# Patient Record
Sex: Female | Born: 1995 | Hispanic: No | Marital: Married
Health system: Midwestern US, Community
[De-identification: ages and names within clinical notes are randomized; demographics above are authoritative.]

## PROBLEM LIST (undated history)

## (undated) DIAGNOSIS — Z789 Other specified health status: Secondary | ICD-10-CM

## (undated) DIAGNOSIS — R059 Cough, unspecified: Secondary | ICD-10-CM

## (undated) DIAGNOSIS — G8110 Spastic hemiplegia affecting unspecified side: Principal | ICD-10-CM

## (undated) DIAGNOSIS — D480 Neoplasm of uncertain behavior of bone and articular cartilage: Secondary | ICD-10-CM

## (undated) DIAGNOSIS — Z020 Encounter for examination for admission to educational institution: Secondary | ICD-10-CM

## (undated) DIAGNOSIS — G35 Multiple sclerosis: Principal | ICD-10-CM

## (undated) HISTORY — PX: BONE TUMOR EXCISION: SHX1254

## (undated) HISTORY — DX: Other specified health status: Z78.9

## (undated) HISTORY — PX: WRIST SURGERY: SHX841

## (undated) HISTORY — PX: WISDOM TOOTH EXTRACTION: SHX21

---
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
## 2015-12-03 ENCOUNTER — Inpatient Hospital Stay: Admit: 2015-12-03 | Discharge: 2015-12-03 | Disposition: A | Discharge status: Home / Self Care

## 2015-12-03 DIAGNOSIS — S91312A Laceration without foreign body, left foot, initial encounter: Secondary | ICD-10-CM

## 2015-12-03 MED ORDER — TETANUS-DIPHTH-ACELL PERTUSSIS 5-2.5-18.5 LF-MCG/0.5 IM SUSP
Freq: Once | INTRAMUSCULAR | Status: AC
Start: 2015-12-03 — End: 2015-12-03
  Administered 2015-12-03: 14:00:00 0.5 mL via INTRAMUSCULAR

## 2015-12-03 MED FILL — BOOSTRIX 5-2.5-18.5 IM SUSP: INTRAMUSCULAR | Qty: 0.5

## 2015-12-03 NOTE — ED Notes (Signed)
Wound cleansed, antibiotic ointment applied with telfa dressing.     Gerlene Fee, [place]  [id number]

## 2015-12-03 NOTE — ED Provider Notes (Signed)
Department of Emergency Medicine  ED Provider Note  Admit Date: 12/03/2015  Room: 04/04            HPI:  12/03/15, Time: 9:24 AM  .       Brittany Fitzgerald is a 20 y.o. old female presenting to the emergency department for a laceration to the left foot, caused by metal edge, which occured 1 hour(s) prior to arrival. There is not a possibility of retained foreign body in the affected area.  The patients tetanus status is unknown.   Bleeding is  controlled. There is pain at injury site. The injury was not work related.       Review of Systems:   Pertinent positives and negatives are stated within HPI, all other systems reviewed and are negative.          --------------------------------------------- PAST HISTORY ---------------------------------------------  Past Medical History:  has no past medical history on file.    Past Surgical History:  has no past surgical history on file.    Social History:  reports that she has never smoked. She does not have any smokeless tobacco history on file. She reports that she does not drink alcohol.    Family History: family history is not on file.     The patient???s home medications have been reviewed.    Allergies: Review of patient's allergies indicates no known allergies.    -------------------------------------------------- RESULTS -------------------------------------------------  All laboratory and radiology results have been personally reviewed by myself   LABS:  No results found for this visit on 12/03/15.    RADIOLOGY:  Interpreted by Radiologist.  No orders to display       ------------------------- NURSING NOTES AND VITALS REVIEWED ---------------------------   The nursing notes within the ED encounter and vital signs as below have been reviewed.   Visit Vitals   ??? BP 101/64   ??? Pulse 50   ??? Temp 98.1 ??F (36.7 ??C) (Oral)   ??? Resp 14   ??? Wt 140 lb (63.5 kg)   ??? LMP 11/29/2015   ??? BMI 24.03 kg/m2     Oxygen Saturation Interpretation:  Normal      ---------------------------------------------------PHYSICAL EXAM--------------------------------------      Constitutional/General: Alert and oriented x3, well appearing, non toxic in NAD  Head: Normocephalic and atraumatic  Eyes: PERRL, EOMI  Mouth: Oropharynx clear, handling secretions, no trismus  Neck: Supple, full ROM,   Pulmonary: Lungs clear to auscultation bilaterally, no wheezes, rales, or rhonchi. Not in respiratory distress  Cardiovascular:  Regular rate and rhythm, no murmurs, gallops, or rubs. 2+ distal pulses  Abdomen: Soft, non tender, non distended,   Extremities: Moves all extremities x 4. Warm and well perfused  Skin: warm and dry without rash. There is a laceration of the left foot, which measures 1.0 cm. It is a partial thickness laceration. There is no evidence of foreign body or gross contamination.   Neurologic: GCS 15,  Psych: Normal Affect      ------------------------------ ED COURSE/MEDICAL DECISION MAKING----------------------  Medications   Tetanus-Diphth-Acell Pertussis (BOOSTRIX) injection 0.5 mL (not administered)             LACERATION REPAIR  PROCEDURE NOTE:  Unless otherwise indicated, this procedure was performed by PAQ-C      Laceration #: 1.  Location: Left foot  Length: 1.0 cm.  The wound area was irrigated with sterile saline, cleansend with shur-clens and draped in a sterile fashion.  The wound area was anesthetized with not required.  WOUND COMPLEXITY:    Debridement: None.  Undermining: None.  Wound Margins Revised: None.  Flaps Aligned: no.  The wound was explored with the following results no foreign body or tendon injury seen.  The wound was cleansed. No suturing was required..  Dressing:  bacitracin and a sterile dressing was placed.    Total number suture and 0 sutures were required.:       Medical Decision Making:    Patient had a partial thickness puncture to the left medial foot. This was less than 1 cm in length. It did not separate or gait. No retained  foreign bodies were appreciated. Wound was cleansed. Bacitracin and bandage were applied. Patient had her tetanus status updated.     Counseling:   The emergency provider has spoken with the patient and discussed today???s results, in addition to providing specific details for the plan of care and counseling regarding the diagnosis and prognosis.  Questions are answered at this time and they are agreeable with the plan.      --------------------------------- IMPRESSION AND DISPOSITION ---------------------------------    IMPRESSION  1. Foot laceration, left, initial encounter        DISPOSITION  Disposition: Discharge to home  Patient condition is good           Ardeen Garland, [place]  12/03/15 [phone]

## 2017-12-20 ENCOUNTER — Ambulatory Visit: Admit: 2017-12-20 | Discharge: 2017-12-20 | Attending: Family Medicine | Primary: Internal Medicine

## 2017-12-20 DIAGNOSIS — Z020 Encounter for examination for admission to educational institution: Secondary | ICD-10-CM

## 2017-12-20 MED ORDER — FLUTICASONE PROPIONATE 50 MCG/ACT NA SUSP
50 MCG/ACT | Freq: Every day | NASAL | 2 refills | Status: DC
Start: 2017-12-20 — End: 2020-02-10

## 2017-12-20 MED ORDER — DROSPIRENONE-ETHINYL ESTRADIOL 3-0.03 MG PO TABS
3-0.03 | PACK | Freq: Every day | ORAL | 5 refills | Status: DC
Start: 2017-12-20 — End: 2020-02-10

## 2017-12-20 NOTE — Progress Notes (Signed)
[facility]    12/20/2017    Brittany Fitzgerald (DOB:  1996/02/07) is a 22 y.o. female, here for a preventive medicine evaluation.    Patient Active Problem List   Diagnosis   ??? Chronic maxillary sinusitis   ??? Dysmenorrhea   ??? Encounter for school history and physical examination     Sinus issues recently.  Got x-rays and was told sinuses were full - at the dentist.  She gets a bit of drainage and it tastes bloody.  Has feeling of dizzyness for the last few months.  No fever.  Some pain in bilateral cheeks.      Periods - regular, heavy, bad cramping.  She is not on any meds for this.  Has never taken any hormones for this.    Main issues - here to have PT forms filled out.  No other complaints.    Review of Systems   Constitutional: Negative for activity change, appetite change, chills, fever and unexpected weight change.   HENT: Positive for congestion, postnasal drip, sinus pressure and sinus pain. Negative for facial swelling and sore throat.    Respiratory: Negative for cough and shortness of breath.    Gastrointestinal: Positive for constipation. Negative for blood in stool and diarrhea.   Endocrine: Negative for cold intolerance, heat intolerance, polydipsia, polyphagia and polyuria.   Genitourinary: Positive for menstrual problem. Negative for vaginal bleeding and vaginal discharge.   Musculoskeletal: Negative for arthralgias, joint swelling and myalgias.   Skin: Negative for pallor.   Neurological: Positive for dizziness, seizures and headaches.   Hematological: Negative for adenopathy. Does not bruise/bleed easily.       Prior to Visit Medications    Medication Sig Taking? Authorizing Provider   fluticasone (FLONASE) 50 MCG/ACT nasal spray 2 sprays by Each Nare route daily For 1 week and then 1 spray each nostril daily Yes Audria Nine, MD   drospirenone-ethinyl estradiol 3-0.03 MG TABS Take 1 tablet by mouth daily Start the Sunday you start your period Yes Audria Nine, MD   Multiple  Vitamins-Minerals (THERAPEUTIC MULTIVITAMIN-MINERALS) tablet Take 1 tablet by mouth daily Yes Historical Provider, MD        No Known Allergies    Past Medical History:   Diagnosis Date   ??? Febrile seizure (Isabela)        Past Surgical History:   Procedure Laterality Date   ??? WISDOM TOOTH EXTRACTION      bottom only       Social History     Socioeconomic History   ??? Marital status: Single     Spouse name: Not on file   ??? Number of children: Not on file   ??? Years of education: Not on file   ??? Highest education level: Not on file   Occupational History   ??? Occupation: Ship broker   Social Needs   ??? Financial resource strain: Not on file   ??? Food insecurity:     Worry: Not on file     Inability: Not on file   ??? Transportation needs:     Medical: Not on file     Non-medical: Not on file   Tobacco Use   ??? Smoking status: Never Smoker   ??? Smokeless tobacco: Never Used   Substance and Sexual Activity   ??? Alcohol use: Not Currently     Comment: rarely   ??? Drug use: Never   ??? Sexual activity: Yes     Partners: Male  Birth control/protection: Condom     Comment: one partner.  condom.     Lifestyle   ??? Physical activity:     Days per week: Not on file     Minutes per session: Not on file   ??? Stress: Not on file   Relationships   ??? Social connections:     Talks on phone: Not on file     Gets together: Not on file     Attends religious service: Not on file     Active member of club or organization: Not on file     Attends meetings of clubs or organizations: Not on file     Relationship status: Not on file   ??? Intimate partner violence:     Fear of current or ex partner: Not on file     Emotionally abused: Not on file     Physically abused: Not on file     Forced sexual activity: Not on file   Other Topics Concern   ??? Not on file   Social History Narrative   ??? Not on file        Family History   Problem Relation Age of Onset   ??? High Blood Pressure Father    ??? Asthma Maternal Uncle    ??? Diabetes Maternal Grandmother    ??? Coronary Art  Dis Maternal Grandmother    ??? Arthritis Maternal Grandmother        ADVANCE DIRECTIVE: N, Not Received    Vitals:    12/20/17 1318   BP: 120/70   Site: Left Upper Arm   Position: Sitting   Pulse: 90   Resp: 16   Temp: 98 ??F (36.7 ??C)   SpO2: 98%   Weight: 150 lb 12.8 oz (68.4 kg)   Height: 5\' 4"  (1.626 m)     Estimated body mass index is 25.88 kg/m?? as calculated from the following:    Height as of this encounter: 5\' 4"  (1.626 m).    Weight as of this encounter: 150 lb 12.8 oz (68.4 kg).    Physical Exam   Constitutional: She is oriented to person, place, and time. Vital signs are normal. She appears well-developed and well-nourished. She is active. No distress.   HENT:   Head: Normocephalic and atraumatic.   Right Ear: Tympanic membrane, external ear and ear canal normal.   Left Ear: Tympanic membrane, external ear and ear canal normal.   Nose: Mucosal edema present. No rhinorrhea. Right sinus exhibits maxillary sinus tenderness. Left sinus exhibits maxillary sinus tenderness.   Mouth/Throat: Uvula is midline, oropharynx is clear and moist and mucous membranes are normal.   Mucosal stippling in back of throat.  Minimal pain to pal of maxillary sinuses bilaterally.   Eyes: Conjunctivae are normal. Right eye exhibits no discharge. Left eye exhibits no discharge. No scleral icterus.   Neck: Normal range of motion. Neck supple. Carotid bruit is not present. No tracheal deviation present. No thyroid mass and no thyromegaly present.   Cardiovascular: Normal rate, regular rhythm, S1 normal, S2 normal and normal heart sounds. Exam reveals no gallop and no friction rub.   No murmur heard.  Pulmonary/Chest: Effort normal and breath sounds normal. No respiratory distress. She has no wheezes. She has no rales.   Abdominal: Soft. Bowel sounds are normal. She exhibits no distension and no mass. There is no hepatosplenomegaly. There is no tenderness. There is no rebound and no guarding.   Musculoskeletal: Normal range of motion.  She exhibits no edema.   Lymphadenopathy:     She has cervical adenopathy.   Neurological: She is alert and oriented to person, place, and time.   Skin: Skin is warm and dry. She is not diaphoretic. No pallor.   Psychiatric: She has a normal mood and affect. Her behavior is normal. Judgment and thought content normal.   Nursing note and vitals reviewed.      No flowsheet data found.    No results found for: CHOL, CHOLFAST, TRIG, TRIGLYCFAST, HDL, LDLCHOLESTEROL, LDLCALC, GLUF, GLUCOSE, LABA1C    The ASCVD Risk score Mikey Bussing DC Jr., et al., 2013) failed to calculate for the following reasons:    The 2013 ASCVD risk score is only valid for ages 69 to 74    Immunization History   Administered Date(s) Administered   ??? Tdap (Boostrix, Adacel) 12/03/2015       Health Maintenance   Topic Date Due   ??? Varicella Vaccine (1 of 2 - 13+ 2-dose series) 10/09/2007   ??? HPV vaccine (1 - Female 3-dose series) 10/08/2009   ??? HIV screen  10/08/2009   ??? Chlamydia screen  10/08/2010   ??? Cervical cancer screen  10/09/2015   ??? Flu vaccine (1) 06/01/2017   ??? DTaP/Tdap/Td vaccine (2 - Td) 12/02/2025       ASSESSMENT/PLAN:  1. Encounter for school history and physical examination  Paperwork filled out.  Titers and PPD done today.  Please see paperwork scanned in the chart.  - Mantoux testing  - HEPATITIS B SURFACE ANTIBODY; Future  - MUMPS ANTIBODY, IGG; Future  - Measles Immune IgG; Future  - RUBELLA IMMUNE; Future    2. Chronic maxillary sinusitis  Will try flonase first.  If this does not help, she will RTC and we will try an antibiotic.  - fluticasone (FLONASE) 50 MCG/ACT nasal spray; 2 sprays by Each Nare route daily For 1 week and then 1 spray each nostril daily  Dispense: 1 Bottle; Refill: 2    3. Dysmenorrhea  Pt will try OCP.  Mom had a bad experience with OCPs.  We discussed that there are multiple versions of OCPs so we can try a variety.  RTC in 3 mn to re-evaluate.  Pt is aware that she may have to adjust to meds over the next 3  mn and she may have side effects.  These were discussed.  - drospirenone-ethinyl estradiol 3-0.03 MG TABS; Take 1 tablet by mouth daily Start the Sunday you start your period  Dispense: 1 packet; Refill: 5  - C. Trachomatis / N. Gonorrhoeae, DNA Probe; Future      Return in about 3 months (around 03/22/2018) for to discuss period pain.    An electronic signature was used to authenticate this note.    --Audria Nine, MD on 12/20/2017 at 1:58 PM

## 2017-12-20 NOTE — Patient Instructions (Signed)
Patient Education        Chronic Sinusitis: Care Instructions  Your Care Instructions    Sinusitis is an infection of the lining of the sinus cavities in your head. It causes pain and pressure in your head and face.  Sinusitis can be short-term (acute) or long-term (chronic). Chronic sinusitis lasts 12 weeks or longer. It is often caused by a bacterial or fungal infection. Other things, such as allergies, may also be involved.  Chronic sinusitis may be hard to treat. It can lead to permanent changes in the mucous membranes that line the sinuses. It may make future sinus infections more likely.  The infection may take some time to treat. Antibiotics are usually used if the infection is caused by bacteria. You may also need to use a corticosteroid nasal spray. If the infection is not cured after you try two or more different antibiotics, you may want to talk with your doctor about surgery or allergy testing.  If the sinusitis is caused by a fungal infection, you may need to take antifungals or other medicines. You may also need surgery.  Follow-up care is a key part of your treatment and safety. Be sure to make and go to all appointments, and call your doctor if you are having problems. It's also a good idea to know your test results and keep a list of the medicines you take.  How can you care for yourself at home?  Medicines    Be safe with medicines. Take your medicines exactly as prescribed. Call your doctor if you think you are having a problem with your medicine. You will get more details on the specific medicines your doctor prescribes.     Take your antibiotics as directed. Do not stop taking them just because you feel better. You need to take the full course of antibiotics.     Your doctor may recommend a corticosteroid nasal spray, wash, drops, or pills. Take this medicine exactly as prescribed.   At home    Breathe warm, moist air. You can use a steamy shower, a hot bath, or a sink filled with hot  water. Avoid cold, dry air. Using a humidifier in your home may help. Follow the instructions for cleaning the machine.     Use saline (saltwater) nasal washes every day. This helps keep your nasal passages open. It also can wash out mucus and bacteria.  ? You can buy saline nose drops at a grocery store or drugstore.  ? You can make your own at home. Add 1 teaspoon of salt and 1 teaspoon of baking soda to 2 cups of distilled water. If you make your own, fill a bulb syringe with the solution. Then insert the tip into your nostril and squeeze gently. Blow your nose.     Put a warm, wet towel or a warm gel pack on your face 3 or 4 times a day. Leave it on 5 to 10 minutes each time.     Do not smoke or breathe secondhand smoke. Smoking can make sinusitis worse. If you need help quitting, talk to your doctor about stop-smoking programs and medicines. These can increase your chances of quitting for good.   When should you call for help?  Call your doctor now or seek immediate medical care if:    You have new or worse symptoms of infection, such as:  ? Increased pain, swelling, warmth, or redness.  ? Red streaks leading from the area.  ? Pus draining  from the area.  ? A fever.   Watch closely for changes in your health, and be sure to contact your doctor if:    The mucus from your nose becomes thicker (like pus) or has new blood in it.     You do not get better as expected.   Where can you learn more?  Go to [email or web address] and sign in to your MyChart account. Enter [phone] in the Search Health Information box to learn more about "Chronic Sinusitis: Care Instructions."     If you do not have an account, please click on the "Sign Up Now" link.  Current as of: December 25, 2016  Content Version: 11.9   2006-2018 [employer]. Care instructions adapted under license by [facility]. If you have questions about a medical condition or this instruction, always ask your healthcare  professional. [employer], [employer] disclaims any warranty or liability for your use of this information.         Patient Education        Painful Menstrual Cramps: Care Instructions  Your Care Instructions    Painful menstrual cramps are very common. Many women go to the doctor because of bad cramps when they get their period.  You may have cramps in your back, thighs, and belly. You may also have diarrhea, constipation, or nausea. Some women also get dizzy.  Pain medicine and home treatment can help you feel better.  Follow-up care is a key part of your treatment and safety. Be sure to make and go to all appointments, and call your doctor if you are having problems. It's also a good idea to know your test results and keep a list of the medicines you take.  How can you care for yourself at home?   Take anti-inflammatory medicines for pain. Ibuprofen (Advil, Motrin) and naproxen (Aleve) usually work better than aspirin.  ? Be safe with medicines. Talk to your doctor or pharmacist before you take any of these medicines. They may not be safe if you take other medicines or have other health problems.  ? Start taking the recommended dose of pain medicine as soon as you start to feel pain. Or you can start on the day before your period. Keep taking the medicine for as many days as you have cramps.  ? If anti-inflammatory medicines don't help, try acetaminophen (Tylenol).  ? Do not take two or more pain medicines at the same time unless the doctor told you to. Many pain medicines have acetaminophen, which is Tylenol. Too much acetaminophen (Tylenol) can be harmful.  ? Read and follow all instructions on the label.   Put a heating pad set on low or a hot water bottle on your belly. Or take a warm bath. Heat improves blood flow and may help with pain.   Lie down and put a pillow under your knees. Or lie on your side and bring your knees up to your chest. This will help with any back pressure.   Get at least 30  minutes of exercise on most days of the week. This improves blood flow and may decrease pain. Walking is a good choice. You also may want to do other activities, such as running, swimming, cycling, or playing tennis or team sports.  When should you call for help?  Call your doctor now or seek immediate medical care if:    You have new or worse belly or pelvic pain.     You have severe vaginal bleeding.  Watch closely for changes in your health, and be sure to contact your doctor if:    You have unusual vaginal bleeding.     You do not get better as expected.   Where can you learn more?  Go to [email or web address] and sign in to your MyChart account. Enter [id number] in the Search Health Information box to learn more about "Painful Menstrual Cramps: Care Instructions."     If you do not have an account, please click on the "Sign Up Now" link.  Current as of: Feb 11, 2017  Content Version: 11.9   2006-2018 [employer]. Care instructions adapted under license by [facility]. If you have questions about a medical condition or this instruction, always ask your healthcare professional. [employer] disclaims any warranty or liability for your use of this information.

## 2017-12-21 ENCOUNTER — Inpatient Hospital Stay: Payer: PRIVATE HEALTH INSURANCE | Primary: Internal Medicine

## 2017-12-23 ENCOUNTER — Encounter: Admit: 2017-12-23 | Discharge: 2017-12-23 | Payer: PRIVATE HEALTH INSURANCE | Primary: Internal Medicine

## 2017-12-23 LAB — MANTOUX TESTING: TB Skin Test: NEGATIVE

## 2017-12-23 LAB — RUBELLA IMMUNE

## 2017-12-24 ENCOUNTER — Telehealth

## 2017-12-24 LAB — MUMPS ANTIBODY, IGG

## 2017-12-24 NOTE — Telephone Encounter (Signed)
LM for pt to call back.

## 2017-12-24 NOTE — Telephone Encounter (Signed)
Please call pt.    We did not draw a varicella antibody titer on her to check for chickenpox.  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  Did she need this?  If yes, please call the lab and see if they still have the blood - can we add this to the blood?  Thanks.

## 2017-12-25 LAB — C. TRACHOMATIS / N. GONORRHOEAE, DNA
C. Trachomatis Amplified: NEGATIVE
N. Gonorrhoeae Amplified: NEGATIVE

## 2017-12-25 NOTE — Telephone Encounter (Signed)
Spoke with Oris Drone in the lab to see if the Varicella titer could be added to the order. They will check to see if   The specimen is still there since today is day 5. Will notify us if it can/not be done

## 2017-12-26 LAB — MEASLES IMMUNE IGG

## 2017-12-26 LAB — VARICELLA ZOSTER ANTIBODY, IGG

## 2017-12-26 NOTE — Telephone Encounter (Signed)
Spoke with patient, made her an appt for tomorrow, if we hear back from the lab that they can add the varicella  I will cancel the appt.

## 2017-12-26 NOTE — Telephone Encounter (Signed)
Result is posted, she is immune. Danielle please cancel the appt for Friday!   Thank you.   mb

## 2017-12-26 NOTE — Telephone Encounter (Signed)
Please let me know if I need to do anything else.  I have placed an order.  Thanks.    lnw

## 2017-12-27 ENCOUNTER — Encounter: Primary: Internal Medicine

## 2017-12-28 LAB — MISC ARUP 2

## 2017-12-30 ENCOUNTER — Encounter: Admit: 2017-12-30 | Discharge: 2017-12-30 | Primary: Internal Medicine

## 2017-12-30 DIAGNOSIS — Z111 Encounter for screening for respiratory tuberculosis: Secondary | ICD-10-CM

## 2018-01-01 ENCOUNTER — Encounter: Admit: 2018-01-01 | Discharge: 2018-01-01 | Primary: Internal Medicine

## 2018-01-01 LAB — MANTOUX TESTING: TB Skin Test: NEGATIVE

## 2018-09-27 ENCOUNTER — Emergency Department: Admit: 2018-09-27 | Payer: PRIVATE HEALTH INSURANCE | Primary: Internal Medicine

## 2018-09-27 ENCOUNTER — Inpatient Hospital Stay
Admit: 2018-09-27 | Discharge: 2018-09-27 | Disposition: A | Discharge status: Home / Self Care | Payer: PRIVATE HEALTH INSURANCE

## 2018-09-27 DIAGNOSIS — J40 Bronchitis, not specified as acute or chronic: Secondary | ICD-10-CM

## 2018-09-27 NOTE — Discharge Instructions (Addendum)
Take ibuprofen for pain and inflammation  Take robitussin DM for cough and chest congestion.

## 2018-09-27 NOTE — ED Provider Notes (Signed)
This is a 22 year old female who presents to urgent care with her mother.  She is been complaining of some right anterior lateral chest wall pain for the past couple months he got worse over the last couple days she is been coughing and having some upper respiratory and sinus issues she was seen in urgent care at another facility and was started on antibiotics for a sinus infection she is currently taking the antibiotic.  She took some Tylenol for the pain last night.  She complains of a cough.  She does have some URI symptoms denies any abdominal pain nausea vomiting diarrhea or urinary symptoms.  On first contact patient she appears to be in no acute distress.  He denies rash.          Review of Systems   Constitutional:        Pertinent positives and negatives are stated within HPI, all other systems reviewed and are negative.       Physical Exam  Vitals signs and nursing note reviewed.   Constitutional:       Appearance: She is well-developed.   HENT:      Head: Normocephalic and atraumatic.      Right Ear: Hearing, tympanic membrane, ear canal and external ear normal.      Left Ear: Hearing, tympanic membrane, ear canal and external ear normal.      Nose: Nose normal.      Mouth/Throat:      Pharynx: Uvula midline.   Eyes:      General: Lids are normal.      Conjunctiva/sclera: Conjunctivae normal.      Pupils: Pupils are equal, round, and reactive to light.   Neck:      Musculoskeletal: Normal range of motion and neck supple.   Cardiovascular:      Rate and Rhythm: Normal rate and regular rhythm.      Heart sounds: Normal heart sounds. No murmur.   Pulmonary:      Effort: Pulmonary effort is normal.      Breath sounds: Normal breath sounds. No decreased breath sounds, wheezing, rhonchi or rales.   Chest:      Comments: Some right lower anterior chest wall tenderness no abdominal pain.  Abdominal:      General: Bowel sounds are normal.      Palpations: Abdomen is soft. Abdomen is not rigid.      Tenderness:  There is no tenderness. There is no guarding or rebound.   Skin:     General: Skin is warm and dry.      Findings: No abrasion or rash.   Neurological:      Mental Status: She is alert and oriented to person, place, and time.      GCS: GCS eye subscore is 4. GCS verbal subscore is 5. GCS motor subscore is 6.      Cranial Nerves: No cranial nerve deficit.      Sensory: No sensory deficit.      Coordination: Coordination normal.      Gait: Gait normal.         Procedures    MDM       --------------------------------------------- PAST HISTORY ---------------------------------------------  Past Medical History:  has a past medical history of Febrile seizure ([place]).    Past Surgical History:  has a past surgical history that includes Wisdom tooth extraction.    Social History:  reports that she has never smoked. She has never used smokeless tobacco. She reports  previous alcohol use. She reports that she does not use drugs.    Family History: family history includes Arthritis in her maternal grandmother; Asthma in her maternal uncle; Coronary Art Dis in her maternal grandmother; Diabetes in her maternal grandmother; High Blood Pressure in her father.     The patient???s home medications have been reviewed.    Allergies: Patient has no known allergies.    -------------------------------------------------- RESULTS -------------------------------------------------  No results found for this visit on 09/27/18.  XR CHEST STANDARD (2 VW)   Final Result   1. No acute cardiopulmonary disease.          ------------------------- NURSING NOTES AND VITALS REVIEWED ---------------------------   The nursing notes within the ED encounter and vital signs as below have been reviewed.   BP 112/69    Pulse 95    Temp 98.8 ??F (37.1 ??C) (Oral)    Resp 16    Ht 5\' 4"  (1.626 m)    Wt 150 lb (68 kg)    LMP 09/24/2018    SpO2 100%    BMI 25.75 kg/m??   Oxygen Saturation Interpretation: Normal      ------------------------------------------ PROGRESS  NOTES ------------------------------------------   I have spoken with the patient and mother and discussed today???s results, in addition to providing specific details for the plan of care and counseling regarding the diagnosis and prognosis.  Their questions are answered at this time and they are agreeable with the plan.      --------------------------------- ADDITIONAL PROVIDER NOTES ---------------------------------       This patient is stable for discharge.  I have shared the specific conditions for return, as well as the importance of follow-up.      * NOTE: This report was transcribed using voice recognition software. Every effort was made to ensure accuracy; however, inadvertent computerized transcription errors may be present.    --------------------------------- IMPRESSION AND DISPOSITION ---------------------------------    IMPRESSION  1. Bronchitis        DISPOSITION  Disposition: Discharge to home  Patient condition is good       Randal Buba, PA-C  09/27/18 1543

## 2018-12-09 ENCOUNTER — Ambulatory Visit: Admit: 2018-12-09 | Discharge: 2018-12-09 | Attending: Adult Health | Primary: Internal Medicine

## 2018-12-09 DIAGNOSIS — Z020 Encounter for examination for admission to educational institution: Secondary | ICD-10-CM

## 2018-12-09 NOTE — Progress Notes (Signed)
Chief Complaint:   School/Camp Physical (physical therapy clinicals )      History of Present Illness   Source of history provided by:  patient.      Brittany Fitzgerald is a 23 y.o. old female who has a past medical history of   Patient Active Problem List   Diagnosis   ??? Chronic maxillary sinusitis   ??? Dysmenorrhea   ??? Encounter for school history and physical examination    presents to the ready care for a sports physical.  Pt states she is feeling well without any complaints at this time.  She denies any CP with exertion, DOE, dizziness with exertion, history of syncope without trauma, palpitations, heavy menstrual periods, chance of pregnancy, weakness in extremities, recent illness, previous cardiac issues,  or asthma history.  Patient had febrile seizures as a toddler, no seizures since. Denies any family history of sudden cardiac death.  Pt denies any drug, ETOH, or tobacco use.  Wears seat belt in the car at all times.  Denies any thoughts of suicide or issues at school relating to bullying.      ROS    Unless otherwise stated in this report or unable to obtain because of the patient's clinical or mental status as evidenced by the medical record, this patients's positive and negative responses for Review of Systems, constitutional, psych, eyes, ENT, cardiovascular, respiratory, gastrointestinal, neurological, genitourinary, musculoskeletal, integument systems and systems related to the presenting problem are either stated in the preceding or were not pertinent or were negative for the symptoms and/or complaints related to the medical problem.    Past Surgical History:   Past Surgical History:   Procedure Laterality Date   ??? WISDOM TOOTH EXTRACTION      bottom only     Social History:  reports that she has never smoked. She has never used smokeless tobacco. She reports previous alcohol use. She reports that she does not use drugs.  Family History: family history includes Arthritis in her maternal grandmother;  Asthma in her maternal uncle; Coronary Art Dis in her maternal grandmother; Diabetes in her maternal grandmother; High Blood Pressure in her father.   Allergies: Patient has no known allergies.    Physical Exam         VS:  BP 118/74 (Site: Right Upper Arm, Position: Sitting)    Pulse 73    Ht 5' 4.5" (1.638 m)    Wt 153 lb (69.4 kg)    SpO2 98%    BMI 25.86 kg/m??    Oxygen Saturation Interpretation: Normal.    Constitutional:  A&Ox3, NAD, development consistent with age.  Head:  NCAT  Eyes:  EOMI, PERRLA. No conjunctival injection noted.  Ears:  External ears without lesions. Ear canals without swelling or exudate. TMs translucent bilaterally with normal light reflex.  Throat:  Posterior pharynx without erythema or exudate.  Airway patient.  Neck:  Supple. Nontender without adenopathy or thyromegaly.  Lungs: CTAB without wheezing, rales, or rhonchi.  Heart:  RRR, normal heart sounds without pathological murmurs.   Abdomen:  Soft, nontender, and nondistended. BS+x4. No guarding, rigidity, or rebound tenderness. No masses.  Back:  ROM physiologic. Normal posture and spinal alignment. No costovertebral, paravertebral, intervertebral, or vertebral tenderness or spasm.  Extremities:  No tenderness or swelling. ROM physiologic.  No neurovascular deficit.  Strength and grip 5/5 bilaterally.  Skin:  Warm and appropriately dry without rashes, abrasions, ecchymoses, or lesions.  Neuro:  Orientation age-appropriate.  Motor functions intact. DTRs  2+ and equal bilaterally.  Psych: Pleasant and appropriate. Normal mood and affect.    Lab / Imaging Results   (All laboratory and radiology results have been personally reviewed by myself)  Labs:  No results found for this visit on 12/09/18.  Imaging:  All Radiology results interpreted by Radiologist unless otherwise noted.  No orders to display       Assessment / Plan     Impression(s):    1. School physical exam  Pt appears to be in good health overall. She is cleared for sports  for one year from this date without restrictions. Sports physical form scanned to chart. Advised to return immediately with any changes in physical or mental health. All questions answered.    2. PPD screening test    - Mantoux testing    3. Need for vaccination    - Hep B Vaccine Adult (ENGERIX B)      Return if symptoms worsen or fail to improve.      Retta Mac, [place]

## 2018-12-11 LAB — MANTOUX TESTING: TB Skin Test: NEGATIVE

## 2018-12-19 ENCOUNTER — Encounter: Primary: Internal Medicine

## 2019-01-06 ENCOUNTER — Encounter: Primary: Internal Medicine

## 2019-07-21 ENCOUNTER — Inpatient Hospital Stay: Payer: PRIVATE HEALTH INSURANCE | Primary: Internal Medicine

## 2019-07-21 DIAGNOSIS — Z0000 Encounter for general adult medical examination without abnormal findings: Secondary | ICD-10-CM

## 2019-07-21 LAB — COMPREHENSIVE METABOLIC PANEL
ALT: 12 U/L (ref 0–32)
AST: 17 U/L (ref 0–31)
Albumin: 4.4 g/dL (ref 3.5–5.2)
Alkaline Phosphatase: 51 U/L (ref 35–104)
Anion Gap: 9 mmol/L (ref 7–16)
BUN: 16 mg/dL (ref 6–20)
CO2: 25 mmol/L (ref 22–29)
Calcium: 9.3 mg/dL (ref 8.6–10.2)
Chloride: 102 mmol/L (ref 98–107)
Creatinine: 1.1 mg/dL — ABNORMAL HIGH (ref 0.5–1.0)
GFR African American: >60
GFR Non-African American: >60 mL/min/{1.73_m2} (ref >=60)
Glucose: 89 mg/dL (ref 74–99)
Potassium: 4 mmol/L (ref 3.5–5.0)
Sodium: 136 mmol/L (ref 132–146)
Total Bilirubin: 0.4 mg/dL (ref 0.0–1.2)
Total Protein: 7.2 g/dL (ref 6.4–8.3)

## 2019-07-21 LAB — CBC WITH AUTO DIFFERENTIAL
Basophils %: 1.2 % (ref 0.0–2.0)
Basophils Absolute: 0.06 E9/L (ref 0.00–0.20)
Eosinophils %: 6.8 % — ABNORMAL HIGH (ref 0.0–6.0)
Eosinophils Absolute: 0.34 E9/L (ref 0.05–0.50)
Hematocrit: 42 % (ref 34.0–48.0)
Hemoglobin: 13.5 g/dL (ref 11.5–15.5)
Immature Granulocytes #: 0.01 E9/L
Immature Granulocytes %: 0.2 % (ref 0.0–5.0)
Lymphocytes %: 53.7 % — ABNORMAL HIGH (ref 20.0–42.0)
Lymphocytes Absolute: 2.67 E9/L (ref 1.50–4.00)
MCH: 29.5 pg (ref 26.0–35.0)
MCHC: 32.1 % (ref 32.0–34.5)
MCV: 91.9 fL (ref 80.0–99.9)
MPV: 8.5 fL (ref 7.0–12.0)
Monocytes %: 7.2 % (ref 2.0–12.0)
Monocytes Absolute: 0.36 E9/L (ref 0.10–0.95)
Neutrophils %: 30.9 % — ABNORMAL LOW (ref 43.0–80.0)
Neutrophils Absolute: 1.53 E9/L — ABNORMAL LOW (ref 1.80–7.30)
Platelets: 307 E9/L (ref 130–450)
RBC: 4.57 E12/L (ref 3.50–5.50)
RDW: 11.9 fL (ref 11.5–15.0)
WBC: 5 E9/L (ref 4.5–11.5)

## 2019-07-21 LAB — LIPID PANEL
Cholesterol, Total: 248 mg/dL — ABNORMAL HIGH (ref 0–199)
HDL: 73 mg/dL (ref >40)
LDL Calculated: 157 mg/dL — ABNORMAL HIGH (ref 0–99)
Triglycerides: 88 mg/dL (ref 0–149)
VLDL Cholesterol Calculated: 18 mg/dL

## 2019-07-21 LAB — TSH: TSH: 2.14 u[IU]/mL (ref 0.270–4.200)

## 2019-07-21 LAB — VITAMIN D 25 HYDROXY: Vit D, 25-Hydroxy: 30 ng/mL (ref 30–100)

## 2019-07-21 LAB — T4: T4, Total: 7.5 ug/dL (ref 4.5–11.7)

## 2019-07-21 LAB — VITAMIN B12: Vitamin B-12: 710 pg/mL (ref 211–946)

## 2019-10-28 LAB — COMPREHENSIVE METABOLIC PANEL
ALT: 18 U/L (ref 0–32)
Albumin: 4.5 g/dL (ref 3.5–5.2)
Alkaline Phosphatase: 44 U/L (ref 35–104)
Anion Gap: 12 mmol/L (ref 7–16)
CO2: 24 mmol/L (ref 22–29)
Calcium: 9.5 mg/dL (ref 8.6–10.2)
Chloride: 106 mmol/L (ref 98–107)
Creatinine: 1 mg/dL (ref 0.5–1.0)
GFR African American: >60
GFR Non-African American: >60 mL/min/1.73 (ref >=60)
Glucose: 93 mg/dL (ref 74–99)
Potassium: 4.3 mmol/L (ref 3.5–5.0)
Sodium: 142 mmol/L (ref 132–146)
Total Bilirubin: 0.4 mg/dL (ref 0.0–1.2)
Total Protein: 7.2 g/dL (ref 6.4–8.3)

## 2019-10-28 LAB — MAGNESIUM: Magnesium: 2.3 mg/dL (ref 1.6–2.6)

## 2019-10-28 LAB — CK: Total CK: 1006 U/L — ABNORMAL HIGH (ref 20–180)

## 2019-11-14 LAB — COVID-19, ANTIBODY, TOTAL: SARS-CoV-2 Antibody, Total: NONREACTIVE

## 2019-11-23 ENCOUNTER — Inpatient Hospital Stay
Admit: 2019-11-23 | Discharge: 2019-11-23 | Disposition: A | Discharge status: Home / Self Care | Payer: PRIVATE HEALTH INSURANCE

## 2019-11-23 DIAGNOSIS — H8113 Benign paroxysmal vertigo, bilateral: Secondary | ICD-10-CM

## 2019-11-23 LAB — URINALYSIS
Bilirubin Urine: NEGATIVE
Glucose, Ur: NEGATIVE mg/dL
Ketones, Urine: NEGATIVE mg/dL
Leukocyte Esterase, Urine: NEGATIVE
Nitrite, Urine: NEGATIVE
Protein, UA: NEGATIVE mg/dL
Specific Gravity, UA: 1.025 (ref 1.005–1.030)
Urobilinogen, Urine: 0.2 E.U./dL (ref <2.0)
pH, UA: 6 (ref 5.0–9.0)

## 2019-11-23 LAB — CBC WITH AUTO DIFFERENTIAL
Basophils %: 1 % (ref 0.0–2.0)
Basophils Absolute: 0.05 E9/L (ref 0.00–0.20)
Eosinophils %: 6.3 % — ABNORMAL HIGH (ref 0.0–6.0)
Eosinophils Absolute: 0.31 E9/L (ref 0.05–0.50)
Hematocrit: 40.5 % (ref 34.0–48.0)
Hemoglobin: 13.3 g/dL (ref 11.5–15.5)
Immature Granulocytes #: 0.01 E9/L
Immature Granulocytes %: 0.2 % (ref 0.0–5.0)
Lymphocytes %: 51.8 % — ABNORMAL HIGH (ref 20.0–42.0)
Lymphocytes Absolute: 2.55 E9/L (ref 1.50–4.00)
MCH: 29.6 pg (ref 26.0–35.0)
MCHC: 32.8 % (ref 32.0–34.5)
MCV: 90 fL (ref 80.0–99.9)
Monocytes %: 8.1 % (ref 2.0–12.0)
Monocytes Absolute: 0.4 E9/L (ref 0.10–0.95)
Neutrophils %: 32.6 % — ABNORMAL LOW (ref 43.0–80.0)
Neutrophils Absolute: 1.6 E9/L — ABNORMAL LOW (ref 1.80–7.30)
Platelets: 315 E9/L (ref 130–450)
RBC: 4.5 E12/L (ref 3.50–5.50)
RDW: 11.6 fL (ref 11.5–15.0)
WBC: 4.9 E9/L (ref 4.5–11.5)

## 2019-11-23 LAB — POCT VENOUS
GFR African American: >60
GFR Non-African American: >60 mL/min/{1.73_m2} (ref >=60)
POC Chloride: 109 mmol/L — ABNORMAL HIGH (ref 100–108)
POC Creatinine: 0.9 mg/dL (ref 0.5–1.0)
POC Glucose: 110 mg/dl — ABNORMAL HIGH (ref 74–99)
POC Potassium: 4 mmol/L (ref 3.5–5.0)
POC Sodium: 142 mmol/L (ref 132–146)

## 2019-11-23 LAB — MICROSCOPIC URINALYSIS

## 2019-11-23 LAB — PREGNANCY, URINE: HCG(Urine) Pregnancy Test: NEGATIVE

## 2019-11-23 MED ORDER — MECLIZINE HCL 25 MG PO TABS
25 MG | ORAL_TABLET | Freq: Three times a day (TID) | ORAL | 0 refills | Status: AC | PRN
Start: 2019-11-23 — End: 2019-11-30

## 2019-11-23 MED ORDER — ONDANSETRON 4 MG PO TBDP
4 MG | ORAL_TABLET | Freq: Three times a day (TID) | ORAL | 0 refills | Status: DC | PRN
Start: 2019-11-23 — End: 2020-02-10

## 2019-11-23 NOTE — Discharge Instructions (Signed)
If no relief with the Antivert and zofran, go to the Emergency Department

## 2019-11-23 NOTE — ED Provider Notes (Addendum)
Department of Emergency Medicine  [facility]  Provider Note  Admit Date/Time: 11/23/2019  8:41 AM  Room: 03/03  NAME: Brittany Fitzgerald  DOB: 1995-12-17  MRN: [id number]     Chief Complaint:  Dizziness (Pt states "I notice it more when I change positions / I sleep on My Right side as it seems to affect the Left side of My face More" ), Nausea, and Emesis    History of Present Illness        Brittany Fitzgerald is a 24 y.o. female who has a past medical history of:   Past Medical History:   Diagnosis Date   . Febrile seizure ([facility])     49-5 years old    presents to the urgent care center by private car for a sensation that  Everything is  spinning when she moves her head or changes positions.  She said that this started yesterday and she has had some nausea and vomiting with it.  She said she does not have a headache does not have any visual change she denies any cold or sinus symptoms.  Denies any chest pain or shortness of breath.  States she has never had this before.    ROS    Pertinent positives and negatives are stated within HPI, all other systems reviewed and are negative.    Past Surgical History:   Procedure Laterality Date   . WISDOM TOOTH EXTRACTION      bottom only   Social History:  reports that she has never smoked. She has never used smokeless tobacco. She reports current alcohol use. She reports that she does not use drugs.  Family History: family history includes Arthritis in her maternal grandmother; Asthma in her maternal uncle; Coronary Art Dis in her maternal grandfather; Diabetes in her maternal grandfather; High Blood Pressure in her father.  Allergies: Patient has no known allergies.    Physical Exam   Oxygen Saturation Interpretation: Normal.   ED Triage Vitals [11/23/19 0844]   BP Temp Temp Source Pulse Resp SpO2 Height Weight   129/80 97.5 F (36.4 C) Temporal 69 16 99 % 5\' 4"  (1.626 m) 160 lb (72.6 kg)       Physical Exam   Constitutional/General: Alert and  oriented x3, well appearing, non toxic in NAD   HEENT:  NC/NT. PERRLA,  EOMS intact Airway patent.   Neck: Supple, full ROM,    Respiratory: Lungs clear to auscultation bilaterally, no wheezes, rales, or rhonchi. Not in respiratory distress   CV:  Regular rate. Regular rhythm. No murmurs, gallops, or rubs. 2+ distal pulses   GI:  Abdomen Soft, Non tender, Non distended.  +BS.   No rebound, guarding, or rigidity. No pulsatile masses.   Musculoskeletal: Moves all extremities x 4.    Integument: skin warm and dry. No rashes.    Neurologic: GCS 15, no focal deficits, symmetric strength 5/5 in the upper and lower extremities bilaterally   Psychiatric: Normal Affect    Lab / Imaging Results   (All laboratory and radiology results have been personally reviewed by myself)  Labs:  Results for orders placed or performed during the hospital encounter of 11/23/19   Pregnancy, Urine   Result Value Ref Range    HCG(Urine) Pregnancy Test NEGATIVE NEGATIVE   Urinalysis   Result Value Ref Range    Color, UA Yellow Straw/Yellow    Clarity, UA Clear Clear    Glucose, Ur Negative Negative mg/dL  Bilirubin Urine Negative Negative    Ketones, Urine Negative Negative mg/dL    Specific Gravity, UA 1.025 1.005 - 1.030    Blood, Urine MODERATE (A) Negative    pH, UA 6.0 5.0 - 9.0    Protein, UA Negative Negative mg/dL    Urobilinogen, Urine 0.2 <2.0 E.U./dL    Nitrite, Urine Negative Negative    Leukocyte Esterase, Urine Negative Negative   CBC Auto Differential   Result Value Ref Range    WBC 4.9 4.5 - 11.5 E9/L    RBC 4.50 3.50 - 5.50 E12/L    Hemoglobin 13.3 11.5 - 15.5 g/dL    Hematocrit 11.9 14.7 - 48.0 %    MCV 90.0 80.0 - 99.9 fL    MCH 29.6 26.0 - 35.0 pg    MCHC 32.8 32.0 - 34.5 %    RDW 11.6 11.5 - 15.0 fL    Platelets 315 130 - 450 E9/L    MPV 8.4 7.0 - 12.0 fL    Neutrophils % 32.6 (L) 43.0 - 80.0 %    Immature Granulocytes % 0.2 0.0 - 5.0 %    Lymphocytes % 51.8 (H) 20.0 - 42.0 %    Monocytes % 8.1 2.0 - 12.0 %     Eosinophils % 6.3 (H) 0.0 - 6.0 %    Basophils % 1.0 0.0 - 2.0 %    Neutrophils Absolute 1.60 (L) 1.80 - 7.30 E9/L    Immature Granulocytes # 0.01 E9/L    Lymphocytes Absolute 2.55 1.50 - 4.00 E9/L    Monocytes Absolute 0.40 0.10 - 0.95 E9/L    Eosinophils Absolute 0.31 0.05 - 0.50 E9/L    Basophils Absolute 0.05 0.00 - 0.20 E9/L   Microscopic Urinalysis   Result Value Ref Range    WBC, UA NONE 0 - 5 /HPF    RBC, UA 0-1 0 - 2 /HPF    Epithelial Cells, UA RARE /HPF    Bacteria, UA RARE (A) None Seen /HPF   POCT Venous   Result Value Ref Range    POC Sodium 142 132 - 146 mmol/L    POC Potassium 4.0 3.5 - 5.0 mmol/L    POC Chloride 109 (H) 100 - 108 mmol/L    POC Glucose 110 (H) 74 - 99 mg/dl    POC Creatinine 0.9 0.5 - 1.0 mg/dL    GFR Non-African American >60 >=60 mL/min/1.73    GFR African American >60     Performed on SEE BELOW      Imaging:  All Radiology results interpreted by Radiologist unless otherwise noted.  No orders to display       ED Course / Medical Decision Making   Medications - No data to display       Consult(s):    MDM:   I did check labs on her this seems like if this is a vertigo problem --- mainly everything seems to spin when she turns her head or changes positions. Her  urine does have blood in it but she is having her menstrual period.  Pregnancy was negative the remainder the labs are unremarkable.  Exam is negative she does not have any headache or pain anywhere.  I did start her on some Antivert and some Zofran and I told her if this does not help her symptoms and she needs to go to the emergency department for further evaluation.        Assessment      1. Benign paroxysmal  positional vertigo due to bilateral vestibular disorder      Plan   Discharge to home and advised to contact Calton Dach, MD  [address]  [place] [address]  [phone]    Schedule an appointment as soon as possible for a visit      Patient condition is good    New Medications     New  Prescriptions    MECLIZINE (ANTIVERT) 25 MG TABLET    Take 1 tablet by mouth 3 times daily as needed for Dizziness    ONDANSETRON (ZOFRAN ODT) 4 MG DISINTEGRATING TABLET    Take 1 tablet by mouth every 8 hours as needed for Nausea or Vomiting     Electronically signed by Reino Kent, APRN - CNP   DD: 11/23/19  **This report was transcribed using voice recognition software. Every effort was made to ensure accuracy; however, inadvertent computerized transcription errors may be present.  END OF ED PROVIDER NOTE     Reino Kent, APRN - CNP  11/23/19 0945       Reino Kent, APRN - CNP  11/23/19 0945

## 2020-02-10 ENCOUNTER — Emergency Department: Admit: 2020-02-11 | Payer: PRIVATE HEALTH INSURANCE | Primary: Internal Medicine

## 2020-02-10 DIAGNOSIS — S63501A Unspecified sprain of right wrist, initial encounter: Secondary | ICD-10-CM

## 2020-02-10 NOTE — Discharge Instructions (Addendum)
Please return to the ED with new or worsening symptoms. Follow up with orthopaedics for further evaluation and treatment for abnormalities found on wrist x-ray. (they mentioned POSSIBLE giant cell tumor of bone or endochondroma...orthopaedics will further discus this with you).

## 2020-02-10 NOTE — ED Notes (Signed)
Pt. C/o R. wrist and forearm pain after hitting a golf club into the ground while playing golf today.     Sharlyne Pacas [facility], RN  02/10/20 2157

## 2020-02-10 NOTE — ED Notes (Signed)
Velcro wrist splint applied to R. wrist.     Willette Brace, RN  02/10/20 2241

## 2020-02-10 NOTE — ED Provider Notes (Signed)
[facility]dependent [facility]    HPI:  02/10/20, Time: 8:52 PM EDT         Brittany Fitzgerald is a 24 y.o. female presenting to the ED for right wrist pain, beginning just prior to arrival after hitting the ground when trying to swing a golf club.  The complaint has been persistent, moderate in severity, and worsened by movement of right wrist.  Patient denies any previous injuries to this area.  Pain is mostly located to the medial aspect of the right wrist and does not radiate.  She is been afebrile without recent travel or sick contacts.  Patient denies all other symptoms and injuries at this time.    Review of Systems:   A complete review of systems was performed and pertinent positives and negatives are stated within HPI, all other systems reviewed and are negative.          --------------------------------------------- PAST HISTORY ---------------------------------------------  Past Medical History:  has a past medical history of Febrile seizure ([place]).    Past Surgical History:  has a past surgical history that includes Wisdom tooth extraction.    Social History:  reports that she has never smoked. She has never used smokeless tobacco. She reports current alcohol use. She reports that she does not use drugs.    Family History: family history includes Arthritis in her maternal grandmother; Asthma in her maternal uncle; Coronary Art Dis in her maternal grandfather; Diabetes in her maternal grandfather; High Blood Pressure in her father.     The patient???s home medications have been reviewed.    Allergies: Patient has no known allergies.    -------------------------------------------------- RESULTS -------------------------------------------------  All laboratory and radiology results have been personally reviewed by myself   LABS:  No results found for this visit on 02/10/20.    RADIOLOGY:  Interpreted by Radiologist.  XR HAND RIGHT (MIN 3 VIEWS)   Final Result   No acute post-traumatic abnormality seen.  Multiloculated  lucent/lytic lesion   in the distal ulna.  This could be a giant cell tumor or atypical enchondroma.         XR WRIST RIGHT (MIN 3 VIEWS)   Final Result   Multiloculated lucent lesion in the distal ulna.  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      No acute post-traumatic abnormality.             ------------------------- NURSING NOTES AND VITALS REVIEWED ---------------------------   The nursing notes within the ED encounter and vital signs as below have been reviewed.   BP (!) 128/90    Pulse 86    Temp 97.7 ??F (36.5 ??C) (Infrared)    Resp 18    Ht 5\' 4"  (1.626 m)    Wt 155 lb (70.3 kg)    LMP 02/10/2020    SpO2 97%    BMI 26.61 kg/m??   Oxygen Saturation Interpretation: Normal      ---------------------------------------------------PHYSICAL EXAM--------------------------------------      Constitutional/General: Alert and oriented x3, well appearing, non toxic in NAD  Head: Normocephalic and atraumatic  Eyes: PERRL, EOMI  Mouth: Oropharynx clear, handling secretions, no trismus  Neck: Supple, full ROM,   Pulmonary: Lungs clear to auscultation bilaterally, no wheezes, rales, or rhonchi. Not in respiratory distress  Cardiovascular:  Regular rate and rhythm, no murmurs, gallops, or rubs. 2+ distal pulses  Abdomen: Soft, non tender, non distended,   Extremities: Moves all extremities x 4. Warm and well perfused.  Tenderness  to palpation to the right wrist more particularly to the ulnar aspect which does radiate up the ulna to about a third of the way up the forearm.  Compartments are soft in her right forearm.  Capillary refill less than 2 seconds at the fingers of the right hand.  Skin: warm and dry without rash  Neurologic: GCS 15,  Psych: Normal Affect      ------------------------------ ED COURSE/MEDICAL DECISION MAKING----------------------  Medications - No data to display      ED COURSE:  ED Course as of Feb 12 1008   Wed Feb 10, 2020   2229 Discussed case with Dr. Scarlette Calico.  Agreeable to outpatient follow up.     [MS]   2230 Reassessed patient.  Remained stable and neurovascularly intact.  Discussed results with the patient and her friend.  At this time recommended very close outpatient follow-up with orthopedics for further evaluation treatment, particularly secondary to the lucent/lytic lesion noted on the x-rays today.  Will provide a wrist splint for symptomatic treatment at home.  She may use this as needed.  She may also take Tylenol Motrin as needed for pain.  Return to the ED with any new or worsening symptoms. Patient and her friend voiced understanding and are agreeable to the above treatment plan.     [MS]      ED Course User Index  [MS] Doristine Bosworth, [place]       Medical Decision Making:    See ED course above.    Counseling:   The emergency provider has spoken with the patient and friend and discussed today???s results, in addition to providing specific details for the plan of care and counseling regarding the diagnosis and prognosis.  Questions are answered at this time and they are agreeable with the plan.      --------------------------------- IMPRESSION AND DISPOSITION ---------------------------------    IMPRESSION  1. Abnormal musculoskeletal x-ray    2. Sprain of right wrist, initial encounter        DISPOSITION  Disposition: Discharge to home  Patient condition is stable      NOTE: This report was transcribed using voice recognition software. Every effort was made to ensure accuracy; however, inadvertent computerized transcription errors may be present        Doristine Bosworth, [place]  02/12/20 1009

## 2020-02-11 ENCOUNTER — Inpatient Hospital Stay
Admit: 2020-02-11 | Discharge: 2020-02-11 | Disposition: A | Discharge status: Home / Self Care | Payer: PRIVATE HEALTH INSURANCE

## 2020-02-11 ENCOUNTER — Encounter

## 2020-02-12 ENCOUNTER — Encounter

## 2020-02-18 ENCOUNTER — Encounter

## 2020-02-18 ENCOUNTER — Inpatient Hospital Stay: Admit: 2020-02-18 | Payer: PRIVATE HEALTH INSURANCE | Primary: Internal Medicine

## 2020-02-18 DIAGNOSIS — R059 Cough, unspecified: Secondary | ICD-10-CM

## 2020-02-18 DIAGNOSIS — G8110 Spastic hemiplegia affecting unspecified side: Secondary | ICD-10-CM

## 2020-02-18 DIAGNOSIS — D480 Neoplasm of uncertain behavior of bone and articular cartilage: Secondary | ICD-10-CM

## 2020-02-18 MED ORDER — GADOBENATE DIMEGLUMINE 529 MG/ML IV SOLN
529 MG/ML | Freq: Once | INTRAVENOUS | Status: AC | PRN
Start: 2020-02-18 — End: 2020-02-18
  Administered 2020-02-18: 18:00:00 14 mL via INTRAVENOUS

## 2020-03-07 LAB — COMPREHENSIVE METABOLIC PANEL
ALT: 22 U/L (ref 0–32)
AST: 19 U/L (ref 0–31)
Albumin: 3.9 g/dL (ref 3.5–5.2)
Alkaline Phosphatase: 43 U/L (ref 35–104)
Anion Gap: 17 mmol/L — ABNORMAL HIGH (ref 7–16)
BUN: 15 mg/dL (ref 6–20)
CO2: 20 mmol/L — ABNORMAL LOW (ref 22–29)
Calcium: 9.4 mg/dL (ref 8.6–10.2)
Chloride: 106 mmol/L (ref 98–107)
Creatinine: 1.1 mg/dL — ABNORMAL HIGH (ref 0.5–1.0)
GFR African American: >60
GFR Non-African American: >60 mL/min/{1.73_m2} (ref >=60)
Glucose: 98 mg/dL (ref 74–99)
Potassium: 4 mmol/L (ref 3.5–5.0)
Sodium: 143 mmol/L (ref 132–146)
Total Bilirubin: 0.3 mg/dL (ref 0.0–1.2)
Total Protein: 6.5 g/dL (ref 6.4–8.3)

## 2020-03-07 LAB — LIPID PANEL
Cholesterol, Total: 203 mg/dL — ABNORMAL HIGH (ref 0–199)
HDL: 55 mg/dL (ref >40)
LDL Calculated: 127 mg/dL — ABNORMAL HIGH (ref 0–99)
Triglycerides: 107 mg/dL (ref 0–149)
VLDL Cholesterol Calculated: 21 mg/dL

## 2020-03-07 LAB — TSH: TSH: 3.29 u[IU]/mL (ref 0.270–4.200)

## 2020-03-07 LAB — SEDIMENTATION RATE: Sed Rate: 9 mm/Hr (ref 0–20)

## 2020-03-07 LAB — T4: T4, Total: 6.7 ug/dL (ref 4.5–11.7)

## 2020-03-07 LAB — RHEUMATOID FACTOR: Rheumatoid Factor: <10 IU/mL (ref 0–13)

## 2020-03-07 LAB — CK: Total CK: 163 U/L (ref 20–180)

## 2020-03-08 LAB — ANA: ANA: NEGATIVE

## 2020-03-17 ENCOUNTER — Encounter

## 2020-03-24 ENCOUNTER — Inpatient Hospital Stay: Admit: 2020-03-24 | Payer: PRIVATE HEALTH INSURANCE | Primary: Internal Medicine

## 2020-03-24 DIAGNOSIS — G35 Multiple sclerosis: Secondary | ICD-10-CM

## 2020-03-24 MED ORDER — GADOBENATE DIMEGLUMINE 529 MG/ML IV SOLN
529 MG/ML | Freq: Once | INTRAVENOUS | Status: AC | PRN
Start: 2020-03-24 — End: 2020-03-24
  Administered 2020-03-24: 14:00:00 15 mL via INTRAVENOUS

## 2020-07-12 LAB — OB RESULTS CONSOLE ABO/RH: RH Type: POSITIVE

## 2020-07-12 LAB — OB RESULTS CONSOLE ANTIBODY SCREEN: Antibody Screen: NEGATIVE

## 2020-07-12 LAB — OB RESULTS CONSOLE RPR: RPR: NONREACTIVE

## 2020-07-12 LAB — OB RESULTS CONSOLE RUBELLA ANTIBODY, IGM: Rubella: IMMUNE

## 2020-07-12 LAB — OB RESULTS CONSOLE HIV ANTIBODY (ROUTINE TESTING): HIV: NONREACTIVE

## 2020-07-12 LAB — OB RESULTS CONSOLE HEPATITIS B SURFACE ANTIGEN: Hepatitis B Surface Ag: NEGATIVE

## 2020-07-15 ENCOUNTER — Inpatient Hospital Stay (HOSPITAL_COMMUNITY)
Admission: AD | Admit: 2020-07-15 | Payer: No Typology Code available for payment source | Source: Home / Self Care | Admitting: Obstetrics and Gynecology

## 2020-08-01 LAB — OB RESULTS CONSOLE GC/CHLAMYDIA
Chlamydia: NEGATIVE
Gonorrhea: NEGATIVE

## 2020-08-18 ENCOUNTER — Other Ambulatory Visit: Payer: Self-pay | Admitting: Vascular Surgery

## 2020-08-18 ENCOUNTER — Other Ambulatory Visit: Payer: Self-pay | Admitting: Orthopedic Surgery

## 2020-08-18 DIAGNOSIS — D480 Neoplasm of uncertain behavior of bone and articular cartilage: Secondary | ICD-10-CM

## 2020-08-22 ENCOUNTER — Ambulatory Visit (INDEPENDENT_AMBULATORY_CARE_PROVIDER_SITE_OTHER): Payer: No Typology Code available for payment source

## 2020-08-22 ENCOUNTER — Other Ambulatory Visit: Payer: Self-pay

## 2020-08-22 DIAGNOSIS — D480 Neoplasm of uncertain behavior of bone and articular cartilage: Secondary | ICD-10-CM

## 2020-08-22 DIAGNOSIS — D1601 Benign neoplasm of scapula and long bones of right upper limb: Secondary | ICD-10-CM

## 2020-08-22 DIAGNOSIS — Z122 Encounter for screening for malignant neoplasm of respiratory organs: Secondary | ICD-10-CM | POA: Diagnosis not present

## 2020-10-01 NOTE — L&D Delivery Note (Signed)
Delivery Note At 11:09 AM a viable and healthy female was delivered via Vaginal, Spontaneous (Presentation:   Occiput Anterior).  APGAR: 8, 9; weight  pending.   Placenta status: Spontaneous, Intact.  Cord: 3 vessels with the following complications: None.  [place] x one reduced on perineum Cord pH: na  Anesthesia: Epidural Episiotomy:  na Lacerations: 2nd degree;Perineal;Periurethral Suture Repair: 2.0 3.0 vicryl rapide Est. Blood Loss (mL): 500  Mom to postpartum.  Baby to Couplet care / Skin to Skin.  Josede Cicero J 02/07/2021, 12:36 PM

## 2020-11-15 ENCOUNTER — Other Ambulatory Visit: Payer: Self-pay | Admitting: Orthopedic Surgery

## 2020-11-15 DIAGNOSIS — D480 Neoplasm of uncertain behavior of bone and articular cartilage: Secondary | ICD-10-CM

## 2020-11-17 ENCOUNTER — Ambulatory Visit (INDEPENDENT_AMBULATORY_CARE_PROVIDER_SITE_OTHER): Payer: No Typology Code available for payment source

## 2020-11-17 ENCOUNTER — Other Ambulatory Visit: Payer: Self-pay

## 2020-11-17 DIAGNOSIS — D480 Neoplasm of uncertain behavior of bone and articular cartilage: Secondary | ICD-10-CM

## 2020-12-02 ENCOUNTER — Other Ambulatory Visit: Payer: Self-pay

## 2020-12-02 ENCOUNTER — Ambulatory Visit: Payer: No Typology Code available for payment source | Admitting: Medical-Surgical

## 2020-12-02 ENCOUNTER — Encounter: Payer: Self-pay | Admitting: Medical-Surgical

## 2020-12-02 VITALS — BP 95/59 | HR 83 | Temp 98.5°F | Ht 65.0 in | Wt 170.3 lb

## 2020-12-02 DIAGNOSIS — Z7689 Persons encountering health services in other specified circumstances: Secondary | ICD-10-CM | POA: Diagnosis not present

## 2020-12-02 DIAGNOSIS — F418 Other specified anxiety disorders: Secondary | ICD-10-CM | POA: Diagnosis not present

## 2020-12-02 NOTE — Progress Notes (Signed)
New Patient Office Visit  Subjective:  Patient ID: Sheila Daniels, female    DOB: 1995/10/17  Age: 25 y.o. MRN: [id number]  CC:  Chief Complaint  Patient presents with  . Establish Care    HPI [name] presents to establish care. 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 She moved to [place] in November and has not established a new PCP yet. 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 She was found to have a hairline fracture at that time and was restricted from doing manual work until healed. Once she was cleared for work, she found that she was pregnant and had severe nausea and weight loss. She is looking forward to returning to work after her baby is born. Currently seeing [facility] for prenatal care.   Notes that she was stressed prior to getting pregnant and the wrist tumor and subsequent inability to work did not help. Since getting pregnant, she is having a worsening of anxiety and worry. She has never taken medications for her symptoms and would like to avoid having to start anything. Not doing counseling at this time. Feels that her symptoms are manageable but is concerned for possible worsening post-partum. Admits that there have been several days recently that she has passive thoughts of being better off not "being here". No plan for self harm. Denies HI.  Past Medical History:  Diagnosis Date  . No pertinent past medical history     Past Surgical History:  Procedure Laterality Date  . BONE TUMOR EXCISION Right    Giant cell tumor of bone, right distal ulna; Dx by Dr. Noland Fordyce with [place] in [place], [place]  . WISDOM TOOTH EXTRACTION      Family History  Problem Relation Age of Onset  . Hypertension Father   . Diabetes Maternal Grandmother   . Heart attack Maternal Grandmother   . Prostate cancer Paternal Grandfather     Social History    Socioeconomic History  . Marital status: Married    Spouse name: Not on file  . Number of children: Not on file  . Years of education: Not on file  . Highest education level: Not on file  Occupational History  . Not on file  Tobacco Use  . Smoking status: Never Smoker  . Smokeless tobacco: Never Used  Substance and Sexual Activity  . Alcohol use: Never  . Drug use: Never  . Sexual activity: Yes  Other Topics Concern  . Not on file  Social History Narrative  . Not on file   Social Determinants of Health   Financial Resource Strain: Not on file  Food Insecurity: Not on file  Transportation Needs: Not on file  Physical Activity: Not on file  Stress: Not on file  Social Connections: Not on file  Intimate Partner Violence: Not on file    ROS Review of Systems  Constitutional: Negative for chills and fever.  Eyes: Negative for visual disturbance.  Respiratory: Negative for cough, chest tightness, shortness of breath and wheezing.   Cardiovascular: Negative for chest pain, palpitations and leg swelling.  Gastrointestinal: Negative for abdominal pain, constipation, diarrhea, nausea and vomiting.  Neurological: Negative for dizziness, light-headedness and headaches.  Psychiatric/Behavioral: Positive for dysphoric mood, sleep disturbance and suicidal ideas (passive thoughts of being better off not here at times, no plan for self-harm). Negative for self-injury. The patient is nervous/anxious.  Depression screen PHQ 2/9 12/02/2020  Decreased Interest 2  Down, Depressed, Hopeless 1  PHQ - 2 Score 3  Altered sleeping 2  Tired, decreased energy 2  Change in appetite 0  Feeling bad or failure about yourself  2  Trouble concentrating 0  Moving slowly or fidgety/restless 0  Suicidal thoughts 1  PHQ-9 Score 10  Difficult doing work/chores Somewhat difficult   GAD 7 : Generalized Anxiety Score 12/02/2020  Nervous, Anxious, on Edge 3  Control/stop worrying 3  Worry too much  - different things 3  Trouble relaxing 3  Restless 1  Easily annoyed or irritable 2  Afraid - awful might happen 3  Total GAD 7 Score 18  Anxiety Difficulty Very difficult   Objective:   Today's Vitals: BP (!) 95/59   Pulse 83   Temp 98.5 F (36.9 C)   Ht 5\' 5"  (1.651 m)   Wt 170 lb 4.8 oz (77.2 kg)   LMP 05/02/2020   SpO2 98%   BMI 28.34 kg/m   Physical Exam Vitals reviewed.  Constitutional:      General: She is not in acute distress.    Appearance: Normal appearance.  HENT:     Head: Normocephalic and atraumatic.  Cardiovascular:     Rate and Rhythm: Normal rate and regular rhythm.     Pulses: Normal pulses.     Heart sounds: Normal heart sounds. No murmur heard. No friction rub. No gallop.   Pulmonary:     Effort: Pulmonary effort is normal. No respiratory distress.     Breath sounds: Normal breath sounds. No wheezing.  Skin:    General: Skin is warm and dry.  Neurological:     Mental Status: She is alert and oriented to person, place, and time.  Psychiatric:        Mood and Affect: Mood normal.        Behavior: Behavior normal.        Thought Content: Thought content normal.        Judgment: Judgment normal.    Assessment & Plan:   1. Encounter to establish care Reviewed available information and discussed care concerns with patient. She is up to date on preventative care.   2. Anxiety with depression Discussed management of mood concerns during pregnancy. She would like to avoid medications so will hold off on prescribing today. Recommend counseling. Patient would like to think on this since she feels most of her concerns are situational. Will let me know if she would like a referral.   Follow-up: Return if symptoms worsen or fail to improve.   Clearnce Sorrel, DNP, APRN, FNP-BC [facility]

## 2021-01-30 ENCOUNTER — Telehealth (HOSPITAL_COMMUNITY): Payer: Self-pay | Admitting: *Deleted

## 2021-01-30 NOTE — Telephone Encounter (Signed)
Preadmission screen  

## 2021-02-01 ENCOUNTER — Encounter (HOSPITAL_COMMUNITY): Payer: Self-pay | Admitting: *Deleted

## 2021-02-01 ENCOUNTER — Telehealth (HOSPITAL_COMMUNITY): Payer: Self-pay | Admitting: *Deleted

## 2021-02-01 NOTE — Telephone Encounter (Signed)
Preadmission screen  

## 2021-02-02 ENCOUNTER — Other Ambulatory Visit: Payer: Self-pay | Admitting: Obstetrics and Gynecology

## 2021-02-06 ENCOUNTER — Other Ambulatory Visit (HOSPITAL_COMMUNITY)
Admission: RE | Admit: 2021-02-06 | Discharge: 2021-02-06 | Disposition: A | Discharge status: Home / Self Care | Payer: No Typology Code available for payment source | Source: Ambulatory Visit | Attending: Obstetrics and Gynecology | Admitting: Obstetrics and Gynecology

## 2021-02-06 DIAGNOSIS — Z01812 Encounter for preprocedural laboratory examination: Secondary | ICD-10-CM | POA: Insufficient documentation

## 2021-02-06 DIAGNOSIS — Z20822 Contact with and (suspected) exposure to covid-19: Secondary | ICD-10-CM | POA: Insufficient documentation

## 2021-02-06 LAB — SARS CORONAVIRUS 2 (TAT 6-24 HRS): SARS Coronavirus 2: NEGATIVE

## 2021-02-07 ENCOUNTER — Inpatient Hospital Stay (HOSPITAL_COMMUNITY): Payer: No Typology Code available for payment source | Admitting: Anesthesiology

## 2021-02-07 ENCOUNTER — Encounter (HOSPITAL_COMMUNITY): Payer: Self-pay | Admitting: Obstetrics and Gynecology

## 2021-02-07 ENCOUNTER — Inpatient Hospital Stay (HOSPITAL_COMMUNITY): Payer: No Typology Code available for payment source

## 2021-02-07 ENCOUNTER — Inpatient Hospital Stay (HOSPITAL_COMMUNITY)
Admission: AD | Admit: 2021-02-07 | Discharge: 2021-02-09 | DRG: 806 | Disposition: A | Discharge status: Home / Self Care | Payer: No Typology Code available for payment source | Attending: Obstetrics and Gynecology | Admitting: Obstetrics and Gynecology

## 2021-02-07 DIAGNOSIS — Z20822 Contact with and (suspected) exposure to covid-19: Secondary | ICD-10-CM | POA: Diagnosis present

## 2021-02-07 DIAGNOSIS — O480 Post-term pregnancy: Principal | ICD-10-CM | POA: Diagnosis present

## 2021-02-07 DIAGNOSIS — D62 Acute posthemorrhagic anemia: Secondary | ICD-10-CM | POA: Diagnosis present

## 2021-02-07 DIAGNOSIS — O9081 Anemia of the puerperium: Secondary | ICD-10-CM | POA: Diagnosis present

## 2021-02-07 DIAGNOSIS — Z3A40 40 weeks gestation of pregnancy: Secondary | ICD-10-CM

## 2021-02-07 DIAGNOSIS — Z3490 Encounter for supervision of normal pregnancy, unspecified, unspecified trimester: Secondary | ICD-10-CM | POA: Diagnosis present

## 2021-02-07 LAB — CBC
HCT: 33.4 % — ABNORMAL LOW (ref 36.0–46.0)
Hemoglobin: 11 g/dL — ABNORMAL LOW (ref 12.0–15.0)
MCH: 28.8 pg (ref 26.0–34.0)
MCHC: 32.9 g/dL (ref 30.0–36.0)
MCV: 87.4 fL (ref 80.0–100.0)
Platelets: 260 10*3/uL (ref 150–400)
RBC: 3.82 MIL/uL — ABNORMAL LOW (ref 3.87–5.11)
RDW: 13.2 % (ref 11.5–15.5)
WBC: 12.1 10*3/uL — ABNORMAL HIGH (ref 4.0–10.5)
nRBC: 0 % (ref 0.0–0.2)

## 2021-02-07 LAB — TYPE AND SCREEN
ABO/RH(D): A POS
Antibody Screen: NEGATIVE

## 2021-02-07 LAB — RPR: RPR Ser Ql: NONREACTIVE

## 2021-02-07 MED ORDER — OXYCODONE-ACETAMINOPHEN 5-325 MG PO TABS: 2.0000 | ORAL_TABLET | ORAL | Status: DC | PRN

## 2021-02-07 MED ORDER — PHENYLEPHRINE 40 MCG/ML (10ML) SYRINGE FOR IV PUSH (FOR BLOOD PRESSURE SUPPORT): 80.0000 ug | PREFILLED_SYRINGE | INTRAVENOUS | Status: DC | PRN

## 2021-02-07 MED ORDER — LIDOCAINE HCL (PF) 1 % IJ SOLN
INTRAMUSCULAR | Status: DC | PRN
  Administered 2021-02-07 (×2): 5 mL via EPIDURAL

## 2021-02-07 MED ORDER — FENTANYL-BUPIVACAINE-NACL 0.5-0.125-0.9 MG/250ML-% EP SOLN
12.0000 mL/h | EPIDURAL | Status: DC | PRN
Start: 2021-02-07 — End: 2021-02-07
  Administered 2021-02-07: 12 mL/h via EPIDURAL
  Filled 2021-02-07: qty 250

## 2021-02-07 MED ORDER — EPHEDRINE 5 MG/ML INJ: 10.0000 mg | INTRAVENOUS | Status: DC | PRN

## 2021-02-07 MED ORDER — DIPHENHYDRAMINE HCL 25 MG PO CAPS: 25.0000 mg | ORAL_CAPSULE | Freq: Four times a day (QID) | ORAL | Status: DC | PRN

## 2021-02-07 MED ORDER — TERBUTALINE SULFATE 1 MG/ML IJ SOLN: 0.2500 mg | Freq: Once | INTRAMUSCULAR | Status: DC | PRN

## 2021-02-07 MED ORDER — METHYLERGONOVINE MALEATE 0.2 MG PO TABS: 0.2000 mg | ORAL_TABLET | ORAL | Status: DC | PRN

## 2021-02-07 MED ORDER — IBUPROFEN 600 MG PO TABS
600.0000 mg | ORAL_TABLET | Freq: Four times a day (QID) | ORAL | Status: DC
  Administered 2021-02-07 – 2021-02-09 (×7): 600 mg via ORAL
  Filled 2021-02-07 (×7): qty 1

## 2021-02-07 MED ORDER — LACTATED RINGERS IV SOLN: INTRAVENOUS | Status: DC

## 2021-02-07 MED ORDER — COCONUT OIL OIL
1.0000 "application " | TOPICAL_OIL | Status: DC | PRN
  Administered 2021-02-08: 1 via TOPICAL

## 2021-02-07 MED ORDER — PHENYLEPHRINE 40 MCG/ML (10ML) SYRINGE FOR IV PUSH (FOR BLOOD PRESSURE SUPPORT)
80.0000 ug | PREFILLED_SYRINGE | INTRAVENOUS | Status: DC | PRN
  Filled 2021-02-07: qty 10

## 2021-02-07 MED ORDER — BENZOCAINE-MENTHOL 20-0.5 % EX AERO
1.0000 "application " | INHALATION_SPRAY | CUTANEOUS | Status: DC | PRN
  Administered 2021-02-07: 1 via TOPICAL
  Filled 2021-02-07: qty 56

## 2021-02-07 MED ORDER — SOD CITRATE-CITRIC ACID 500-334 MG/5ML PO SOLN: 30.0000 mL | ORAL | Status: DC | PRN

## 2021-02-07 MED ORDER — ONDANSETRON HCL 4 MG/2ML IJ SOLN: 4.0000 mg | INTRAMUSCULAR | Status: DC | PRN

## 2021-02-07 MED ORDER — DIBUCAINE (PERIANAL) 1 % EX OINT: 1.0000 "application " | TOPICAL_OINTMENT | CUTANEOUS | Status: DC | PRN

## 2021-02-07 MED ORDER — ONDANSETRON HCL 4 MG PO TABS: 4.0000 mg | ORAL_TABLET | ORAL | Status: DC | PRN

## 2021-02-07 MED ORDER — ACETAMINOPHEN 325 MG PO TABS: 650.0000 mg | ORAL_TABLET | ORAL | Status: DC | PRN

## 2021-02-07 MED ORDER — ONDANSETRON HCL 4 MG/2ML IJ SOLN
4.0000 mg | Freq: Four times a day (QID) | INTRAMUSCULAR | Status: DC | PRN
  Administered 2021-02-07: 4 mg via INTRAVENOUS
  Filled 2021-02-07: qty 2

## 2021-02-07 MED ORDER — TETANUS-DIPHTH-ACELL PERTUSSIS 5-2.5-18.5 LF-MCG/0.5 IM SUSY: 0.5000 mL | PREFILLED_SYRINGE | Freq: Once | INTRAMUSCULAR | Status: DC

## 2021-02-07 MED ORDER — SIMETHICONE 80 MG PO CHEW: 80.0000 mg | CHEWABLE_TABLET | ORAL | Status: DC | PRN

## 2021-02-07 MED ORDER — MISOPROSTOL 25 MCG QUARTER TABLET
25.0000 ug | ORAL_TABLET | ORAL | Status: DC | PRN
  Administered 2021-02-07: 25 ug via VAGINAL
  Filled 2021-02-07: qty 1

## 2021-02-07 MED ORDER — LACTATED RINGERS IV SOLN
500.0000 mL | Freq: Once | INTRAVENOUS | Status: AC
  Administered 2021-02-07: 500 mL via INTRAVENOUS

## 2021-02-07 MED ORDER — METHYLERGONOVINE MALEATE 0.2 MG/ML IJ SOLN
0.2000 mg | INTRAMUSCULAR | Status: DC | PRN
Start: 2021-02-07 — End: 2021-02-09

## 2021-02-07 MED ORDER — SENNOSIDES-DOCUSATE SODIUM 8.6-50 MG PO TABS
2.0000 | ORAL_TABLET | Freq: Every day | ORAL | Status: DC
  Administered 2021-02-08: 2 via ORAL
  Filled 2021-02-07: qty 2

## 2021-02-07 MED ORDER — LACTATED RINGERS IV SOLN
500.0000 mL | INTRAVENOUS | Status: DC | PRN
Start: 2021-02-07 — End: 2021-02-07

## 2021-02-07 MED ORDER — WITCH HAZEL-GLYCERIN EX PADS: 1.0000 "application " | MEDICATED_PAD | CUTANEOUS | Status: DC | PRN

## 2021-02-07 MED ORDER — LIDOCAINE HCL (PF) 1 % IJ SOLN: 30.0000 mL | INTRAMUSCULAR | Status: DC | PRN

## 2021-02-07 MED ORDER — PRENATAL MULTIVITAMIN CH
1.0000 | ORAL_TABLET | Freq: Every day | ORAL | Status: DC
  Administered 2021-02-08: 1 via ORAL
  Filled 2021-02-07: qty 1

## 2021-02-07 MED ORDER — OXYTOCIN-SODIUM CHLORIDE 30-0.9 UT/500ML-% IV SOLN
1.0000 m[IU]/min | INTRAVENOUS | Status: DC
  Administered 2021-02-07: 1 m[IU]/min via INTRAVENOUS
  Filled 2021-02-07: qty 500

## 2021-02-07 MED ORDER — OXYCODONE-ACETAMINOPHEN 5-325 MG PO TABS: 1.0000 | ORAL_TABLET | ORAL | Status: DC | PRN

## 2021-02-07 MED ORDER — LACTATED RINGERS IV SOLN: 500.0000 mL | Freq: Once | INTRAVENOUS | Status: DC

## 2021-02-07 MED ORDER — ACETAMINOPHEN 325 MG PO TABS
650.0000 mg | ORAL_TABLET | ORAL | Status: DC | PRN
  Administered 2021-02-08: 650 mg via ORAL
  Filled 2021-02-07: qty 2

## 2021-02-07 MED ORDER — ZOLPIDEM TARTRATE 5 MG PO TABS: 5.0000 mg | ORAL_TABLET | Freq: Every evening | ORAL | Status: DC | PRN

## 2021-02-07 MED ORDER — DIPHENHYDRAMINE HCL 50 MG/ML IJ SOLN: 12.5000 mg | INTRAMUSCULAR | Status: DC | PRN

## 2021-02-07 MED ORDER — OXYTOCIN-SODIUM CHLORIDE 30-0.9 UT/500ML-% IV SOLN: 2.5000 [IU]/h | INTRAVENOUS | Status: DC

## 2021-02-07 MED ORDER — OXYTOCIN BOLUS FROM INFUSION: 333.0000 mL | Freq: Once | INTRAVENOUS | Status: DC

## 2021-02-07 NOTE — Anesthesia Procedure Notes (Signed)
Epidural Patient location during procedure: OB Start time: 02/07/2021 3:54 AM End time: 02/07/2021 4:04 AM  Staffing Anesthesiologist: Albertha Ghee, MD Performed: anesthesiologist   Preanesthetic Checklist Completed: patient identified, IV checked, site marked, risks and benefits discussed, monitors and equipment checked, pre-op evaluation and timeout performed  Epidural Patient position: sitting Prep: DuraPrep Patient monitoring: heart rate, cardiac monitor, continuous pulse ox and blood pressure Approach: midline Location: L2-L3 Injection technique: LOR saline  Needle:  Needle type: Tuohy  Needle gauge: 17 G Needle length: 9 cm Needle insertion depth: 6 cm Catheter type: closed end flexible Catheter size: 19 Gauge Catheter at skin depth: 12 cm Test dose: negative and Other  Assessment Events: blood not aspirated, injection not painful, no injection resistance and negative IV test  Additional Notes Informed consent obtained prior to proceeding including risk of failure, 1% risk of PDPH, risk of minor discomfort and bruising.  Discussed rare but serious complications including epidural abscess, permanent nerve injury, epidural hematoma.  Discussed alternatives to epidural analgesia and patient desires to proceed.  Timeout performed pre-procedure verifying patient name, procedure, and platelet count.  Patient tolerated procedure well. Reason for block:procedure for pain

## 2021-02-07 NOTE — Lactation Note (Signed)
This note was copied from a baby's chart. Lactation Consultation Note  Patient Name: Sheila Daniels [id number] Date: 02/07/2021 Reason for consult: L&D Initial assessment Age: 26 hour   Mother in L&D P1, Infant STS in cross cradle hold rooting . Infant placed in football hold. Mother taught hand expression. Observed colostrum. Infant latched on and observed strong suckling and frequent swallows.  Parents happy to see infant swallowing.  Encouraged mother to do STS and cue base feed infant.  Mother informed that [facility] services were available when she gets to her room.  Maternal Data Has patient been taught Hand Expression?: Yes  Feeding Mother's Current Feeding Choice: Breast Milk  LATCH Score Latch: Grasps breast easily, tongue down, lips flanged, rhythmical sucking.  Audible Swallowing: Spontaneous and intermittent  Type of Nipple: Everted at rest and after stimulation  Comfort (Breast/Nipple): Soft / non-tender  Hold (Positioning): Assistance needed to correctly position infant at breast and maintain latch.  LATCH Score: 9   Lactation Tools Discussed/Used    Interventions Interventions: Assisted with latch;Breast feeding basics reviewed;Skin to skin;Hand express;Adjust position;Support pillows;Position options;Breast compression  Discharge    Consult Status Consult Status: Follow-up Date: 02/07/21 Follow-up type: In-patient    Jess Barters [facility] 02/07/2021, 2:59 PM

## 2021-02-07 NOTE — H&P (Signed)
Sheila Daniels is a 25 y.o. female presenting for postdates IOL. OB History    Gravida  1   Para      Term      Preterm      AB      Living        SAB      IAB      Ectopic      Multiple      Live Births             Past Medical History:  Diagnosis Date  . Medical history non-contributory   . No pertinent past medical history    Past Surgical History:  Procedure Laterality Date  . BONE TUMOR EXCISION Right    Giant cell tumor of bone, right distal ulna; Dx by Dr. Noland Fordyce with [place] in [place], [place]  . WISDOM TOOTH EXTRACTION     Family History: family history includes Diabetes in her maternal grandfather; Heart attack in her maternal grandfather; Hypertension in her father; Prostate cancer in her paternal grandfather. Social History:  reports that she has never smoked. She has never used smokeless tobacco. She reports that she does not drink alcohol and does not use drugs.     Maternal Diabetes: No Genetic Screening: Normal Maternal Ultrasounds/Referrals: Normal Fetal Ultrasounds or other Referrals:  None Maternal Substance Abuse:  No Significant Maternal Medications:  None Significant Maternal Lab Results:  Group B Strep negative Other Comments:  None  Review of Systems  Constitutional: Negative.   All other systems reviewed and are negative.  Maternal Medical History:  Reason for admission: Contractions.   Contractions: Onset was yesterday.   Frequency: irregular.   Perceived severity is mild.    Fetal activity: Perceived fetal activity is normal.   Last perceived fetal movement was within the past hour.    Prenatal complications: no prenatal complications Prenatal Complications - Diabetes: none.    Dilation: 4.5 Effacement (%): 60 Station: 0 Exam by:: S.Ramsey, RN/Michelle M. RN Blood pressure 108/64, pulse 83, temperature 98 F (36.7 C), temperature source Oral, resp. rate 17, height 5\' 4"  (1.626 m), weight 86.7  kg, last menstrual period 05/02/2020, SpO2 99 %. Maternal Exam:  Uterine Assessment: Contraction strength is moderate.  Contraction frequency is irregular.   Abdomen: Patient reports no abdominal tenderness. Introitus: Normal vulva. Normal vagina.  Ferning test: not done.  Nitrazine test: not done. Amniotic fluid character: not assessed.  Pelvis: adequate for delivery.   Cervix: Cervix evaluated by digital exam.     Physical Exam Vitals and nursing note reviewed. Exam conducted with a chaperone present.  Constitutional:      Appearance: Normal appearance. She is normal weight.  HENT:     Head: Normocephalic and atraumatic.  Cardiovascular:     Rate and Rhythm: Normal rate and regular rhythm.     Pulses: Normal pulses.     Heart sounds: Normal heart sounds.  Pulmonary:     Effort: Pulmonary effort is normal.     Breath sounds: Normal breath sounds.  Abdominal:     General: Bowel sounds are normal.     Palpations: Abdomen is soft.  Genitourinary:    General: Normal vulva.  Musculoskeletal:        General: Normal range of motion.     Cervical back: Normal range of motion and neck supple.  Skin:    General: Skin is warm and dry.  Neurological:     General: No focal deficit present.  Mental Status: She is alert and oriented to person, place, and time.  Psychiatric:        Mood and Affect: Mood normal.        Behavior: Behavior normal.     Prenatal labs: ABO, Rh: --/--/A POS (05/10 0028) Antibody: NEG (05/10 0028) Rubella: Immune (10/12 0000) RPR: Nonreactive (10/12 0000)  HBsAg: Negative (10/12 0000)  HIV: Non-reactive (10/12 0000)  GBS:   neg  Assessment/Plan: Postdates IOL Pitocin   Sheila Daniels J 02/07/2021, 6:34 AM

## 2021-02-07 NOTE — Lactation Note (Signed)
This note was copied from a baby's chart. Lactation Consultation Note  Patient Name: Sheila Daniels [id number] Date: 02/07/2021 Reason for consult: Follow-up assessment;Mother's request;Primapara;1st time breastfeeding;Term Age:26 hours   On arrival Mom had infant latched on the left breasts. LC helped Mother with transfer and latching on right breasts. Mom nipples are erect and with compression signs of milk transfer noted. Mom shown hand expression and how to offer EBM if unable to get infant to latch.  Plan 1. To feed based on cues 8-12x in 24 hr period no more than 4 hrs without an attempt.           2. Mom to offer EBM via spoon or finger feeding if unable to get infant to latch.          3. I and O sheet reviewed.          [place] brochure of inpatient and outpatient services reviewed.   All questions answered at the end of the visit.   Maternal Data Has patient been taught Hand Expression?: Yes Does the patient have breastfeeding experience prior to this delivery?: No  Feeding Mother's Current Feeding Choice: Breast Milk  LATCH Score Latch: Repeated attempts needed to sustain latch, nipple held in mouth throughout feeding, stimulation needed to elicit sucking reflex.  Audible Swallowing: Spontaneous and intermittent  Type of Nipple: Everted at rest and after stimulation  Comfort (Breast/Nipple): Soft / non-tender  Hold (Positioning): Assistance needed to correctly position infant at breast and maintain latch.  LATCH Score: 8   Lactation Tools Discussed/Used    Interventions Interventions: Breast feeding basics reviewed;Support pillows;Education;Assisted with latch;Position options;Skin to skin;Expressed milk;Breast massage;Hand express;Breast compression;Adjust position  Discharge Pump: Personal WIC Program: No  Consult Status Consult Status: Follow-up Date: 02/08/21 Follow-up type: In-patient    Sheila Royse  Daniels 02/07/2021, 8:27 PM

## 2021-02-07 NOTE — Anesthesia Preprocedure Evaluation (Signed)
Anesthesia Evaluation  Patient identified by MRN, date of birth, ID band Patient awake    Reviewed: Allergy & Precautions, H&P , NPO status , Patient's Chart, lab work & pertinent test results  Airway Mallampati: II   Neck ROM: full    Dental   Pulmonary neg pulmonary ROS,    breath sounds clear to auscultation       Cardiovascular negative cardio ROS   Rhythm:regular Rate:Normal     Neuro/Psych    GI/Hepatic   Endo/Other    Renal/GU      Musculoskeletal   Abdominal   Peds  Hematology   Anesthesia Other Findings   Reproductive/Obstetrics (+) Pregnancy                             Anesthesia Physical Anesthesia Plan  ASA: II  Anesthesia Plan: Epidural   Post-op Pain Management:    Induction: Intravenous  PONV Risk Score and Plan: 2 and Treatment may vary due to age or medical condition  Airway Management Planned: Natural Airway  Additional Equipment:   Intra-op Plan:   Post-operative Plan:   Informed Consent: I have reviewed the patients History and Physical, chart, labs and discussed the procedure including the risks, benefits and alternatives for the proposed anesthesia with the patient or authorized representative who has indicated his/her understanding and acceptance.       Plan Discussed with: CRNA, Anesthesiologist and Surgeon  Anesthesia Plan Comments:         Anesthesia Quick Evaluation

## 2021-02-08 ENCOUNTER — Encounter (HOSPITAL_COMMUNITY): Payer: Self-pay | Admitting: Obstetrics and Gynecology

## 2021-02-08 DIAGNOSIS — D62 Acute posthemorrhagic anemia: Secondary | ICD-10-CM

## 2021-02-08 LAB — CBC
HCT: 29 % — ABNORMAL LOW (ref 36.0–46.0)
Hemoglobin: 9.3 g/dL — ABNORMAL LOW (ref 12.0–15.0)
MCH: 28.5 pg (ref 26.0–34.0)
MCHC: 32.1 g/dL (ref 30.0–36.0)
MCV: 89 fL (ref 80.0–100.0)
Platelets: 190 10*3/uL (ref 150–400)
RBC: 3.26 MIL/uL — ABNORMAL LOW (ref 3.87–5.11)
RDW: 13.4 % (ref 11.5–15.5)
WBC: 12.3 10*3/uL — ABNORMAL HIGH (ref 4.0–10.5)
nRBC: 0 % (ref 0.0–0.2)

## 2021-02-08 MED ORDER — MAGNESIUM OXIDE -MG SUPPLEMENT 400 (240 MG) MG PO TABS: 400.0000 mg | ORAL_TABLET | Freq: Every day | ORAL | Status: DC

## 2021-02-08 MED ORDER — POLYSACCHARIDE IRON COMPLEX 150 MG PO CAPS: 150.0000 mg | ORAL_CAPSULE | Freq: Every day | ORAL | Status: DC

## 2021-02-08 NOTE — Lactation Note (Signed)
This note was copied from a baby's chart. Lactation Consultation Note  Patient Name: Sheila Daniels [id number] Date: 02/08/2021 Reason for consult: Follow-up assessment;Mother's request;Difficult latch;Primapara;1st time breastfeeding;Term;Hyperbilirubinemia Age:26 hours  Mom only collected drops of colostrum with use of DEBP for 15 minutes with 24 flange. Mom stated she did not hava any discomfort with 24 flange. Infant still cueing after 45 minute feeding. Mom gave consent for use of DBM, consent signed and placed in infant's chart.   RN, Sheila Daniels to provide coconut oil for nipple care.   Dad taught how to do paced bottle feeding using extra slow flow nipple. Infant took 10 ml of DBM.   Plan 1. To feed based on cues 8-12x in 24 hr period no more than 4 hrs without an attempt. Mom to offer both breasts with compression looking for swallows.           2. Dad to pace bottle feed with extra slow flow nipple EBM first then [facility] according to breastfeeding supplementation guidelines.           3. Mom to pump DEBP q 3 hrs for 15 minutes.            4 Dad to chart feedings on the I and O sheet.  All questions answered at the end of the visit.   Maternal Data Has patient been taught Hand Expression?: Yes Does the patient have breastfeeding experience prior to this delivery?: No  Feeding Mother's Current Feeding Choice: Breast Milk and Donor Milk Nipple Type: Extra Slow Flow  LATCH Score                    Lactation Tools Discussed/Used Tools: Pump;Flanges Flange Size: 24 Breast pump type: Double-Electric Breast Pump Pump Education: Setup, frequency, and cleaning Reason for Pumping: increase stimulation Pumping frequency: every 3 hrs for 15 minutes  Interventions Interventions: Breast feeding basics reviewed;Education;Expressed milk;Breast massage;Hand express;Coconut oil;DEBP;Breast compression  Discharge Pump: Personal WIC Program: No  Consult Status Consult  Status: Follow-up Date: 02/09/21 Follow-up type: In-patient    Sheila Daniels  Sheila Daniels 02/08/2021, 5:28 PM

## 2021-02-08 NOTE — Lactation Note (Signed)
This note was copied from a baby's chart. Lactation Consultation Note  Patient Name: Sheila Daniels [id number] Date: 02/08/2021 Reason for consult: Follow-up assessment;Mother's request;Primapara;1st time breastfeeding;Term;Infant weight loss Age:26 hours  On arrival, Mom stated infant just fed for 45 minutes on both breasts before arrival. Infant still cueing, asked Mom if she wanted to latch infant. Mom stated prefer to pump on DEBP and offer EBM via spoon or finger feeding.   [place] set Mom on DEBP. Mom pumping q3 hrs for 15 minutes after latching at the breasts, including over night to increase her milk let down.   [place] asked Mom to call out for next feeding to observe a latch.  Infant 3 urine and 3 stool since birth. Last stool 9:24 am last urine charted 23:39 pm last night. Parents stated thought infant had 1 urine in nursery earlier today.  Plan 1. To feed based on cues 8-12x in 24 hr period no more than 4 hrs without an attempt offering both breasts look for swallows with breast compression.          2. Mom to supplement after each feeding with EBM via finger or spoon feeding (7-12 ml) Mom declined using DBM at this time and wanted to see what volume she gets using dEBP           3. I and O sheet reviewed with parents how to chart supplemented volume in mls.            4. Mom to pump q 3 hrs for 15 minutes.  All questions answered at the end of the visit.    Maternal Data Has patient been taught Hand Expression?: Yes Does the patient have breastfeeding experience prior to this delivery?: No  Feeding Mother's Current Feeding Choice: Breast Milk  LATCH Score                    Lactation Tools Discussed/Used Tools: Pump;Flanges Flange Size: 24 Breast pump type: Double-Electric Breast Pump Pump Education: Setup, frequency, and cleaning Reason for Pumping: increase stimulation Pumping frequency: every 3 hrs for 15 minutes  Interventions Interventions: Breast feeding  basics reviewed;Education;Expressed milk;Coconut oil;DEBP;Hand express  Discharge Pump: Personal WIC Program: No  Consult Status Consult Status: Follow-up Date: 02/09/21 Follow-up type: In-patient    Sheila Doughman  Daniels 02/08/2021, 4:19 PM

## 2021-02-08 NOTE — Progress Notes (Signed)
PPD # 1 S/P NSVD  Live born female  Birth Weight: 8 lb 1.1 oz (3660 g) APGAR: 8, 9  Newborn Delivery   Birth date/time: [birth year]2021 11:09:00 Delivery type: Vaginal, Spontaneous     Baby name: "Roylenn" Delivering provider: Brien Few  Episiotomy: n/a  Lacerations:2nd degree;Perineal;Periurethral   circumcision yes, completed today.   Feeding: breast  Pain control at delivery: Epidural   S:  Reports feeling sore with movement. Has concerns for inappropriate latch.              Tolerating po/ No nausea or vomiting             Bleeding is light             Pain controlled with PO ibuprofen and tylenol              Up ad lib / ambulatory / voiding without difficulties   O:  A & O x 3, in no apparent distress              VS:  Vitals:   02/07/21 1426 02/07/21 1815 02/07/21 2206 02/08/21 0307  BP: 126/85 116/84 120/79 116/79  Pulse: 81 71 68   Resp: 18 16 18 18   Temp:  98.4 F (36.9 C) 97.9 F (36.6 C) 98.1 F (36.7 C)  TempSrc:  Oral Oral Oral  SpO2: 99%  98% 98%  Weight:      Height:        LABS:  Recent Labs    02/07/21 0030 02/08/21 0706  WBC 12.1* 12.3*  HGB 11.0* 9.3*  HCT 33.4* 29.0*  PLT 260 190    Blood type: --/--/A POS (05/10 0028)  Rubella: Immune (10/12 0000)   Vaccines: TDaP          UTD         Flu             UTD                    COVID-19 UTD  Gen: AAO x 3, NAD  Abdomen: soft, non-tender, non-distended             Fundus: firm, tender, below umbilicus.   Perineum: tissues well approximated. Mild edema present.   Lochia: Minimal without clots.  Extremities: no edema present, no calf pain or tenderness or sx of DVT.     A/P: PPD # 1 25 y.o., G1P1001   Principal Problem:   Postpartum care following vaginal delivery 5/10  -Continue routine PP orders. Encouraged PO ibuprofen and tylenol for pain management PRN. Recommended frequent ambulation for soreness. Encouraged to rest when baby rest and use of hospital [facility] for breastfeeding  concerns.    Active Problems:   Encounter for induction of labor   SVD 5/10  -Stable status.    Perineal laceration, second degree  -Stable status, continue to monitor for sx of infection. PO ibuprofen and tylenol for pain management PRN.  Acute Anemia r/t Blood loss at Delivery  -Hbg 9.4. Recommend PO Fe tablets for acute anemia &  PO magnesium oxide for gut motility.      Sacha Topor Isaias Sakai) Rollene Rotunda, BSN, RNC-OB  Student Nurse-Midwife   02/08/2021  1:25 PM

## 2021-02-08 NOTE — Anesthesia Postprocedure Evaluation (Signed)
Anesthesia Post Note  Patient: Sheila Daniels  Procedure(s) Performed: AN AD [place]     Patient location during evaluation: Mother Baby Anesthesia Type: Epidural Level of consciousness: awake and alert Pain management: pain level controlled Vital Signs Assessment: post-procedure vital signs reviewed and stable Respiratory status: spontaneous breathing, nonlabored ventilation and respiratory function stable Cardiovascular status: stable Postop Assessment: no headache, no backache and epidural receding Anesthetic complications: no   No complications documented.  Last Vitals:  Vitals:   02/07/21 2206 02/08/21 0307  BP: 120/79 116/79  Pulse: 68   Resp: 18 18  Temp: 36.6 C 36.7 C  SpO2: 98% 98%    Last Pain:  Vitals:   02/08/21 0408  TempSrc:   PainSc: Asleep   Pain Goal:                   Sandrea Matte

## 2021-02-09 MED ORDER — IBUPROFEN 600 MG PO TABS: 600.0000 mg | ORAL_TABLET | Freq: Four times a day (QID) | ORAL | 0 refills | Status: DC

## 2021-02-09 MED ORDER — POLYSACCHARIDE IRON COMPLEX 150 MG PO CAPS: 150.0000 mg | ORAL_CAPSULE | Freq: Every day | ORAL | Status: DC

## 2021-02-09 MED ORDER — ACETAMINOPHEN 325 MG PO TABS: 650.0000 mg | ORAL_TABLET | ORAL | Status: DC | PRN

## 2021-02-09 MED ORDER — MAGNESIUM OXIDE -MG SUPPLEMENT 400 (240 MG) MG PO TABS
400.0000 mg | ORAL_TABLET | Freq: Every day | ORAL | Status: DC
Start: 2021-02-09 — End: 2023-05-03

## 2021-02-09 MED ORDER — BENZOCAINE-MENTHOL 20-0.5 % EX AERO: 1.0000 "application " | INHALATION_SPRAY | CUTANEOUS | Status: DC | PRN

## 2021-02-09 MED ORDER — COCONUT OIL OIL: 1.0000 "application " | TOPICAL_OIL | 0 refills | Status: DC | PRN

## 2021-02-09 NOTE — Discharge Instructions (Signed)
Lactation outpatient support - home visit  Scherry Ran RN, MHA, IBCLC at [employer]: Lactation Consultant  [email or web address] Mail: [email or web address] Tel: [phone]    Additional resources:  [facility] [email or web address]   Chiropractic specialist   Dr. Marja Kays [email or web address]  Craniosacral therapy for baby  Roylene Reason  [email or web address]

## 2021-02-09 NOTE — Discharge Summary (Signed)
OB Discharge Summary  Patient Name: Sheila Daniels DOB: 10-21-1995 MRN: [id number]  Date of admission: 02/07/2021 Delivering provider: Brien Few   Admitting diagnosis: Encounter for induction of labor [Z34.90] Intrauterine pregnancy: [redacted]w[redacted]d     Secondary diagnosis: Patient Active Problem List   Diagnosis Date Noted  . Perineal laceration, second degree 02/08/2021  . Acute blood loss anemia (ABLA) 02/08/2021  . Encounter for induction of labor 02/07/2021  . SVD 5/10 02/07/2021  . Postpartum care following vaginal delivery 5/10 02/07/2021   Additional problems:none  Date of discharge: 02/09/2021   Discharge diagnosis: Principal Problem:   Postpartum care following vaginal delivery 5/10 Active Problems:   Encounter for induction of labor   SVD 5/10   Perineal laceration, second degree   Acute blood loss anemia (ABLA)                                                              Post partum procedures:none  Augmentation: Pitocin and Cytotec Pain control: Epidural  Laceration:2nd degree;Perineal;Periurethral  Episiotomy:  Complications: None  Hospital course:  Induction of Labor With Vaginal Delivery   25 y.o. yo G1P1001 at [redacted]w[redacted]d was admitted to the hospital 02/07/2021 for induction of labor.  Indication for induction: Postdates.  Patient had an uncomplicated labor course as follows: Membrane Rupture Time/Date: 8:02 AM ,02/07/2021   Delivery Method:Vaginal, Spontaneous  Episiotomy:   Lacerations:  2nd degree;Perineal;Periurethral  Details of delivery can be found in separate delivery note.  Patient had a routine postpartum course. Patient is discharged home 02/09/21.  Newborn Data: Birth date:02/07/2021  Birth time:11:09 AM  Gender:Female  Living status:Living  Apgars:8 ,9  Weight:3660 g   Physical exam  Vitals:   02/07/21 2206 02/08/21 0307 02/08/21 2152 02/09/21 0440  BP: 120/79 116/79 118/84 122/77  Pulse: 68  72 79  Resp: 18 18 17 18   Temp: 97.9 F (36.6 C)  98.1 F (36.7 C) 98.4 F (36.9 C) 98 F (36.7 C)  TempSrc: Oral Oral Oral Oral  SpO2: 98% 98% 98% 99%  Weight:      Height:       General: alert and cooperative Lochia: minimal without clots  Uterine Fundus: firm midline, below umbilicus.  Incision: N/A Perineum: repair intact. Tissues well approximated, minmal edema.  DVT Evaluation: No edema, or erythema noted. No signs of DVT.   Labs: Lab Results  Component Value Date   WBC 12.3 (H) 02/08/2021   HGB 9.3 (L) 02/08/2021   HCT 29.0 (L) 02/08/2021   MCV 89.0 02/08/2021   PLT 190 02/08/2021   No flowsheet data found. Edinburgh Postnatal Depression Scale Screening Tool 02/07/2021  I have been able to laugh and see the funny side of things. 0  I have looked forward with enjoyment to things. 0  I have blamed myself unnecessarily when things went wrong. 1  I have been anxious or worried for no good reason. 2  I have felt scared or panicky for no good reason. 0  Things have been getting on top of me. 0  I have been so unhappy that I have had difficulty sleeping. 0  I have felt sad or miserable. 0  I have been so unhappy that I have been crying. 0  The thought of harming myself has occurred to me. 0  Edinburgh Postnatal Depression Scale Total 3   Vaccines: TDaP          UTD         Flu             UTD                    COVID-19 UTD  Discharge instruction:  per After Visit Summary,  [facility] booklet and  "[facility] [place]" hospital booklet  After Visit Meds:  Allergies as of 02/09/2021   No Known Allergies     Medication List    STOP taking these medications   IRON PO     TAKE these medications   acetaminophen 325 MG tablet Commonly known as: Tylenol Take 2 tablets (650 mg total) by mouth every 4 (four) hours as needed (for pain scale < 4).   benzocaine-Menthol 20-0.5 % Aero Commonly known as: DERMOPLAST Apply 1 application topically as needed for irritation (perineal discomfort).   coconut  oil Oil Apply 1 application topically as needed.   ibuprofen 600 MG tablet Commonly known as: ADVIL Take 1 tablet (600 mg total) by mouth every 6 (six) hours.   iron polysaccharides 150 MG capsule Commonly known as: Ferrex 150 Take 1 capsule (150 mg total) by mouth daily.   magnesium oxide 400 (240 Mg) MG tablet Commonly known as: MAG-OX Take 1 tablet (400 mg total) by mouth daily. For prevention of constipation.   PRENATAL VITAMIN PO Take by mouth daily.   VITAMIN D-3 PO Take by mouth daily.            Discharge Care Instructions  (From admission, onward)         Start     Ordered   02/09/21 0000  Discharge wound care:       Comments: Sitz baths 2 times /day with warm water x 1 week. May add herbals: 1 ounce dried comfrey leaf* 1 ounce calendula flowers 1 ounce lavender flowers  Supplies can be found online at [employer] sources at [employer], [employer]  1/2 ounce dried uva ursi leaves 1/2 ounce witch hazel blossoms (if you can find them) 1/2 ounce dried sage leaf 1/2 cup sea salt Directions: Bring 2 quarts of water to a boil. Turn off heat, and place 1 ounce (approximately 1 large handful) of the above mixed herbs (not the salt) into the pot. Steep, covered, for 30 minutes.  Strain the liquid well with a fine mesh strainer, and discard the herb material. Add 2 quarts of liquid to the tub, along with the 1/2 cup of salt. This medicinal liquid can also be made into compresses and peri-rinses.   02/09/21 1002          Diet: routine diet  Activity: Advance as tolerated. Pelvic rest for 6 weeks.   Postpartum contraception: Not Discussed  Newborn Data: Live born female  Birth Weight: 8 lb 1.1 oz (3660 g) APGAR: 8, 9  Newborn Delivery   Birth date/time: 02/07/2021 11:09:00 Delivery type: Vaginal, Spontaneous      Baby boy named "Roylenn" Baby Feeding: Breast Disposition:home with mother Circumcision: Completed 02/08/2021    Delivery  Report:   Review the Delivery Report for details.    Follow up:  Follow-up Information    Brien Few, MD. Schedule an appointment as soon as possible for a visit in 6 week(s).   Specialty: Obstetrics and Gynecology Contact information: [place] [address]  [phone]                 Signed: Deloris Ping, CNM, MSN 02/09/2021, 11:56 AM

## 2021-02-09 NOTE — Lactation Note (Signed)
This note was copied from a baby's chart. Lactation Consultation Note  Patient Name: Boy Joel Mericle UMPNT'I Date: 02/09/2021 Reason for consult: Follow-up assessment Age:26 hours  P1, Observed feeding.  Encouraged mother to hand express before latching to help flow. Rotated infant's body toward mother.   Feed on demand with cues.  Goal 8-12+ times per day after first 24 hrs.  Place baby STS if not cueing.  Reviewed engorgement care and monitoring voids/stools.  Feeding Mother's Current Feeding Choice: Breast Milk and Donor Milk  LATCH Score Latch: Grasps breast easily, tongue down, lips flanged, rhythmical sucking.  Audible Swallowing: A few with stimulation  Type of Nipple: Everted at rest and after stimulation  Comfort (Breast/Nipple): Soft / non-tender  Hold (Positioning): No assistance needed to correctly position infant at breast.  LATCH Score: 9   Interventions Interventions: Breast feeding basics reviewed;Education  Discharge Discharge Education: Engorgement and breast care;Warning signs for feeding baby  Consult Status Consult Status: Complete Date: 02/09/21 Follow-up type: In-patient   Vivianne Master [facility] 02/09/2021, 9:04 AM

## 2021-02-10 ENCOUNTER — Telehealth: Payer: Self-pay | Admitting: General Practice

## 2021-02-10 NOTE — Telephone Encounter (Signed)
Transition Care Management Unsuccessful Follow-up Telephone Call  Date of discharge and from where:  [facility]  Attempts:  1st Attempt  Reason for unsuccessful TCM follow-up call:  Left voice message

## 2021-02-13 NOTE — Telephone Encounter (Signed)
Transition Care Management Unsuccessful Follow-up Telephone Call  Date of discharge and from where:  [facility]  Attempts:  2nd Attempt  Reason for unsuccessful TCM follow-up call:  Left voice message

## 2021-02-15 NOTE — Telephone Encounter (Signed)
Transition Care Management Unsuccessful Follow-up Telephone Call  Date of discharge and from where:  [facility]  Attempts:  3rd Attempt  Reason for unsuccessful TCM follow-up call:  Left voice message

## 2021-03-06 ENCOUNTER — Ambulatory Visit (INDEPENDENT_AMBULATORY_CARE_PROVIDER_SITE_OTHER): Payer: No Typology Code available for payment source

## 2021-03-06 ENCOUNTER — Other Ambulatory Visit: Payer: Self-pay | Admitting: Orthopedic Surgery

## 2021-03-06 ENCOUNTER — Other Ambulatory Visit: Payer: Self-pay

## 2021-03-06 DIAGNOSIS — D480 Neoplasm of uncertain behavior of bone and articular cartilage: Secondary | ICD-10-CM | POA: Diagnosis not present

## 2021-08-11 ENCOUNTER — Encounter: Payer: Self-pay | Admitting: Family Medicine

## 2021-08-11 ENCOUNTER — Ambulatory Visit (INDEPENDENT_AMBULATORY_CARE_PROVIDER_SITE_OTHER): Payer: No Typology Code available for payment source | Admitting: Family Medicine

## 2021-08-11 ENCOUNTER — Other Ambulatory Visit: Payer: Self-pay

## 2021-08-11 VITALS — BP 110/79 | HR 78 | Temp 98.6°F | Wt 162.0 lb

## 2021-08-11 DIAGNOSIS — L989 Disorder of the skin and subcutaneous tissue, unspecified: Secondary | ICD-10-CM

## 2021-08-11 DIAGNOSIS — Z23 Encounter for immunization: Secondary | ICD-10-CM

## 2021-08-11 NOTE — Progress Notes (Signed)
Acute Office Visit  Subjective:    Patient ID: Sheila Daniels, female    DOB: [birth year], 25 y.o.   MRN: [id number]  Chief Complaint  Patient presents with   Nevus    HPI Patient is in today for skin lesion/mole.  Patient states that about a year or two ago she noticed a small raised bump/mole to her left upper arm. Just recently she noticed that seems a little bit larger and darker than before. States it can be slightly tender to touch and occasional itchy, but otherwise not bothersome. She denies any other similar lesions.     Past Medical History:  Diagnosis Date   Medical history non-contributory    No pertinent past medical history     Past Surgical History:  Procedure Laterality Date   BONE TUMOR EXCISION Right    Giant cell tumor of bone, right distal ulna; Dx by Dr. Noland Fordyce with [facility] in [place], [place] EXTRACTION      Family History  Problem Relation Age of Onset   Hypertension Father    Prostate cancer Paternal Grandfather    Heart attack Maternal Grandfather    Diabetes Maternal Grandfather     Social History   Socioeconomic History   Marital status: Married    Spouse name: Not on file   Number of children: Not on file   Years of education: Not on file   Highest education level: Not on file  Occupational History   Not on file  Tobacco Use   Smoking status: Never   Smokeless tobacco: Never  Vaping Use   Vaping Use: Never used  Substance and Sexual Activity   Alcohol use: Never   Drug use: Never   Sexual activity: Yes  Other Topics Concern   Not on file  Social History Narrative   Not on file   Social Determinants of Health   Financial Resource Strain: Not on file  Food Insecurity: Not on file  Transportation Needs: Not on file  Physical Activity: Not on file  Stress: Not on file  Social Connections: Not on file  Intimate Partner Violence: Not on file    Outpatient Medications Prior to Visit   Medication Sig Dispense Refill   benzocaine-Menthol (DERMOPLAST) 20-0.5 % AERO Apply 1 application topically as needed for irritation (perineal discomfort).     Cholecalciferol (VITAMIN D-3 PO) Take by mouth daily.     coconut oil OIL Apply 1 application topically as needed.  0   Prenatal Vit-Fe Fumarate-FA (PRENATAL VITAMIN PO) Take by mouth daily.     acetaminophen (TYLENOL) 325 MG tablet Take 2 tablets (650 mg total) by mouth every 4 (four) hours as needed (for pain scale < 4).     ibuprofen (ADVIL) 600 MG tablet Take 1 tablet (600 mg total) by mouth every 6 (six) hours. 30 tablet 0   iron polysaccharides (FERREX 150) 150 MG capsule Take 1 capsule (150 mg total) by mouth daily.     magnesium oxide (MAG-OX) 400 (240 Mg) MG tablet Take 1 tablet (400 mg total) by mouth daily. For prevention of constipation. 30 tablet    No facility-administered medications prior to visit.    No Known Allergies  Review of Systems All review of systems negative except what is listed in the HPI     Objective:    Physical Exam Vitals reviewed.  Constitutional:      Appearance: Normal appearance.  Skin:    Comments: Small round,  symmetrical, raised lesion. See picture  Neurological:     Mental Status: She is alert and oriented to person, place, and time.  Psychiatric:        Mood and Affect: Mood normal.        Behavior: Behavior normal.        Thought Content: Thought content normal.        Judgment: Judgment normal.          BP 110/79 (BP Location: Left Arm, Patient Position: Sitting, Cuff Size: Normal)   Pulse 78   Temp 98.6 F (37 C) (Oral)   Wt 162 lb 0.6 oz (73.5 kg)   SpO2 94%   BMI 27.81 kg/m  Wt Readings from Last 3 Encounters:  08/11/21 162 lb 0.6 oz (73.5 kg)  02/07/21 191 lb 3.2 oz (86.7 kg)  12/02/20 170 lb 4.8 oz (77.2 kg)    Health Maintenance Due  Topic Date Due   Hepatitis C Screening  Never done   PAP SMEAR-Modifier  Never done    There are no preventive  care reminders to display for this patient.   No results found for: TSH Lab Results  Component Value Date   WBC 12.3 (H) 02/08/2021   HGB 9.3 (L) 02/08/2021   HCT 29.0 (L) 02/08/2021   MCV 89.0 02/08/2021   PLT 190 02/08/2021   No results found for: NA, K, CHLORIDE, CO2, GLUCOSE, BUN, CREATININE, BILITOT, ALKPHOS, AST, ALT, PROT, ALBUMIN, CALCIUM, ANIONGAP, EGFR, GFR No results found for: CHOL No results found for: HDL No results found for: LDLCALC No results found for: TRIG No results found for: CHOLHDL No results found for: HGBA1C     Assessment & Plan:   1. Need for influenza vaccination - Flu Vaccine QUAD 6+ mos PF IM (Fluarix Quad PF)  2. Skin lesion Symmetrical lesion, no alarm findings. Patient agreeable to cryotherapy. Aware that it may fail treatment or return - can consider shave biopsy in the future if needed.   Cryotherapy template Procedure: Cryodestruction of: small lesion to left upper arm Consent obtained and verified. Time-out conducted. Noted no overlying erythema, induration, or other signs of local infection. Completed without difficulty using Cryo-Gun. Advised to call if fevers/chills, erythema, induration, drainage, or persistent bleeding.    Follow-up as needed.   Purcell Nails Olevia Bowens, DNP, FNP-C

## 2021-10-09 ENCOUNTER — Other Ambulatory Visit: Payer: Self-pay | Admitting: Orthopedic Surgery

## 2021-10-09 ENCOUNTER — Other Ambulatory Visit: Payer: Self-pay

## 2021-10-09 ENCOUNTER — Ambulatory Visit (INDEPENDENT_AMBULATORY_CARE_PROVIDER_SITE_OTHER): Payer: No Typology Code available for payment source

## 2021-10-09 DIAGNOSIS — M84439A Pathological fracture, unspecified ulna and radius, initial encounter for fracture: Secondary | ICD-10-CM | POA: Diagnosis not present

## 2021-10-09 DIAGNOSIS — D480 Neoplasm of uncertain behavior of bone and articular cartilage: Secondary | ICD-10-CM | POA: Diagnosis not present

## 2021-11-27 ENCOUNTER — Other Ambulatory Visit: Payer: Self-pay

## 2021-11-27 ENCOUNTER — Ambulatory Visit (INDEPENDENT_AMBULATORY_CARE_PROVIDER_SITE_OTHER): Payer: No Typology Code available for payment source

## 2021-11-27 ENCOUNTER — Other Ambulatory Visit: Payer: Self-pay | Admitting: Orthopedic Surgery

## 2021-11-27 DIAGNOSIS — M84439D Pathological fracture, unspecified ulna and radius, subsequent encounter for fracture with routine healing: Secondary | ICD-10-CM | POA: Diagnosis not present

## 2021-11-27 DIAGNOSIS — D480 Neoplasm of uncertain behavior of bone and articular cartilage: Secondary | ICD-10-CM

## 2022-06-08 ENCOUNTER — Other Ambulatory Visit: Payer: Self-pay | Admitting: Orthopedic Surgery

## 2022-06-08 ENCOUNTER — Ambulatory Visit (INDEPENDENT_AMBULATORY_CARE_PROVIDER_SITE_OTHER): Payer: No Typology Code available for payment source

## 2022-06-08 DIAGNOSIS — D480 Neoplasm of uncertain behavior of bone and articular cartilage: Secondary | ICD-10-CM

## 2022-06-08 DIAGNOSIS — M84431D Pathological fracture, right ulna, subsequent encounter for fracture with routine healing: Secondary | ICD-10-CM

## 2022-06-08 DIAGNOSIS — M84439A Pathological fracture, unspecified ulna and radius, initial encounter for fracture: Secondary | ICD-10-CM

## 2022-09-12 LAB — OB RESULTS CONSOLE ABO/RH: RH Type: POSITIVE

## 2022-09-12 LAB — OB RESULTS CONSOLE ANTIBODY SCREEN: Antibody Screen: NEGATIVE

## 2022-10-01 NOTE — L&D Delivery Note (Signed)
   Delivery Note:   G2P1001 at [redacted]w[redacted]d  Admitting diagnosis: Normal labor [O80, Z37.9] Risks: iron deficiency anemia Onset of labor: 05/02/2023 at 1500 IOL/Augmentation: AROM ROM: 05/03/2023 at 0128  Complete dilation at 05/03/2023 0232 Onset of pushing at 0232 FHR second stage Cat I  Analgesia/Anesthesia intrapartum:None Pushing in lithotomy position with CNM and L&D staff support. Husband, Benett, sister, Ky-Renee, and doula, Deloria Lair, present for birth and supportive.  Delivery of a Live born female  Birth Weight:  pending APGAR: 9, 9  Newborn Delivery   Birth date/time: 05/03/2023 02:38:00 Delivery type: Vaginal, Spontaneous    in cephalic presentation, position OA to LOA.  APGAR:1 min-9 , 5 min-9   Nuchal Cord: Yes  x 1 Cord double clamped after cessation of pulsation, cut by Benett.  Collection of cord blood for typing completed. Arterial cord blood sample-No   Placenta delivered-Spontaneous with 3 vessels. Uterotonics: Pitocin Placenta to L&D Uterine tone firm  Bleeding moderate prior to laceration repair  2nd degree;Sulcus;Labial laceration identified.  Episiotomy:None Local analgesia: Lidocaine  Repair: 2-0 and 3-0 in usual fashion with excellent hemostasis and approximation Est. Blood Loss (mL):385.00  Complications: None  Mom to postpartum. Baby girl to Couplet care / Skin to Skin.  Delivery Report:   Review the Delivery Report for details.    June Leap, CNM, MSN 05/03/2023, 3:08 AM

## 2022-10-12 LAB — OB RESULTS CONSOLE RPR: RPR: NONREACTIVE

## 2022-10-12 LAB — OB RESULTS CONSOLE HEPATITIS B SURFACE ANTIGEN: Hepatitis B Surface Ag: NEGATIVE

## 2022-10-12 LAB — OB RESULTS CONSOLE RUBELLA ANTIBODY, IGM: Rubella: IMMUNE

## 2022-10-12 LAB — OB RESULTS CONSOLE HIV ANTIBODY (ROUTINE TESTING): HIV: NONREACTIVE

## 2022-11-21 LAB — OB RESULTS CONSOLE GC/CHLAMYDIA
Chlamydia: NEGATIVE
Neisseria Gonorrhea: NEGATIVE

## 2022-12-07 ENCOUNTER — Other Ambulatory Visit: Payer: Self-pay | Admitting: Orthopedic Surgery

## 2022-12-07 ENCOUNTER — Ambulatory Visit (INDEPENDENT_AMBULATORY_CARE_PROVIDER_SITE_OTHER): Payer: No Typology Code available for payment source

## 2022-12-07 DIAGNOSIS — D480 Neoplasm of uncertain behavior of bone and articular cartilage: Secondary | ICD-10-CM

## 2023-04-02 ENCOUNTER — Encounter (HOSPITAL_COMMUNITY): Payer: Self-pay

## 2023-04-02 ENCOUNTER — Inpatient Hospital Stay (HOSPITAL_COMMUNITY)
Admission: AD | Admit: 2023-04-02 | Discharge: 2023-04-02 | Disposition: A | Discharge status: Home / Self Care | Payer: No Typology Code available for payment source | Attending: Obstetrics and Gynecology | Admitting: Obstetrics and Gynecology

## 2023-04-02 ENCOUNTER — Other Ambulatory Visit: Payer: Self-pay

## 2023-04-02 DIAGNOSIS — R42 Dizziness and giddiness: Secondary | ICD-10-CM | POA: Diagnosis not present

## 2023-04-02 DIAGNOSIS — O26893 Other specified pregnancy related conditions, third trimester: Secondary | ICD-10-CM | POA: Insufficient documentation

## 2023-04-02 DIAGNOSIS — R002 Palpitations: Secondary | ICD-10-CM | POA: Insufficient documentation

## 2023-04-02 DIAGNOSIS — Z3A35 35 weeks gestation of pregnancy: Secondary | ICD-10-CM | POA: Diagnosis not present

## 2023-04-02 DIAGNOSIS — R55 Syncope and collapse: Secondary | ICD-10-CM

## 2023-04-02 LAB — URINALYSIS, ROUTINE W REFLEX MICROSCOPIC
Bilirubin Urine: NEGATIVE
Glucose, UA: 50 mg/dL — AB
Hgb urine dipstick: NEGATIVE
Ketones, ur: NEGATIVE mg/dL
Leukocytes,Ua: NEGATIVE
Nitrite: NEGATIVE
Protein, ur: NEGATIVE mg/dL
Specific Gravity, Urine: 1.005 (ref 1.005–1.030)
pH: 7 (ref 5.0–8.0)

## 2023-04-02 MED ORDER — INFUVITE ADULT IV SOLN
Freq: Once | INTRAVENOUS | Status: AC
  Filled 2023-04-02: qty 1000

## 2023-04-02 MED ORDER — LACTATED RINGERS IV SOLN: Freq: Once | INTRAVENOUS | Status: DC

## 2023-04-02 NOTE — MAU Note (Addendum)
.  Brianny Mansouri is a 27 y.o. 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 Pt reports at 0930, she had dizziness and tunneling in her vision. Pt reports someone took her blood pressure 120/80.  Pt reports a couple of episodes of dizziness over the last couple of weeks.  Pt reports getting a headache after these episodes. Pt reports it is mild.  Pt reports a 2/10 headache now on the right side Pt reports her heart races during these episodes as well.   Onset of complaint: today  Pain score: denies  There were no vitals filed for this visit.   FHT: 156 Lab orders placed from triage:   ua

## 2023-04-02 NOTE — Discharge Instructions (Signed)
Make sure to include more protein in your meals and snacks everyday

## 2023-04-02 NOTE — MAU Note (Signed)
Per prenatal record: Hx HA's and Migraines noted 06/28/2020.  Per note in Epic on 11/23/2019: Benign paroxysmal positional vertigo due to bilateral vestibular disorder. Patient reported dizziness during this encounter.

## 2023-04-02 NOTE — MAU Provider Note (Signed)
  History     CSN: [id number]  Arrival date and time: 04/02/23 1339   Event Date/Time   First Provider Initiated Contact with Patient 04/02/23 1428      Chief Complaint  Patient presents with  . Dizziness   HPI  {GYN/OB WU:[id number]}  Past Medical History:  Diagnosis Date  . Medical history non-contributory   . No pertinent past medical history     Past Surgical History:  Procedure Laterality Date  . BONE TUMOR EXCISION Right    Giant cell tumor of bone, right distal ulna; Dx by Dr. Lily Peer with [facility] in [place], [place]  . WISDOM TOOTH EXTRACTION    . WRIST SURGERY     2022    Family History  Problem Relation Age of Onset  . Hypertension Father   . Prostate cancer Paternal Grandfather   . Heart attack Maternal Grandfather   . Diabetes Maternal Grandfather     Social History   Tobacco Use  . Smoking status: Never  . Smokeless tobacco: Never  Vaping Use  . Vaping Use: Never used  Substance Use Topics  . Alcohol use: Never  . Drug use: Never    Allergies: No Known Allergies  Medications Prior to Admission  Medication Sig Dispense Refill Last Dose  . iron polysaccharides (FERREX 150) 150 MG capsule Take 1 capsule (150 mg total) by mouth daily.   Past Week  . Prenatal Vit-Fe Fumarate-FA (PRENATAL VITAMIN PO) Take by mouth daily.   04/01/2023  . acetaminophen (TYLENOL) 325 MG tablet Take 2 tablets (650 mg total) by mouth every 4 (four) hours as needed (for pain scale < 4).     . benzocaine-Menthol (DERMOPLAST) 20-0.5 % AERO Apply 1 application topically as needed for irritation (perineal discomfort).     . Cholecalciferol (VITAMIN D-3 PO) Take by mouth daily.     . coconut oil OIL Apply 1 application topically as needed.  0   . ibuprofen (ADVIL) 600 MG tablet Take 1 tablet (600 mg total) by mouth every 6 (six) hours. 30 tablet 0   . magnesium oxide (MAG-OX) 400 (240 Mg) MG tablet Take 1 tablet (400 mg total) by mouth daily. For prevention of  constipation. 30 tablet      Review of Systems Physical Exam   Temperature 97.7 F (36.5 C), resp. rate 12, SpO2 95 %, unknown if currently breastfeeding.  Physical Exam  MAU Course  Procedures  MDM CCUA Start IV MVI in D5LR  No results found for this or any previous visit (from the past 24 hour(s)).  Assessment and Plan  ***  Raelyn Mora, CNM 04/02/2023, 2:28 PM

## 2023-04-12 LAB — OB RESULTS CONSOLE GBS: GBS: NEGATIVE

## 2023-04-15 IMAGING — DX DG WRIST COMPLETE 3+V*R*
4 series · 4 of 4 positions shown · non-contrast
Comparison: Right wrist radiographs 08/22/2020 11/17/2020
03/06/2021 10/09/2021

CLINICAL DATA: Followup giant cell tumor removal [DATE], distal
ulna fracture repair [DATE].

EXAM:
RIGHT WRIST - COMPLETE 3+ VIEW

[wrist obl]
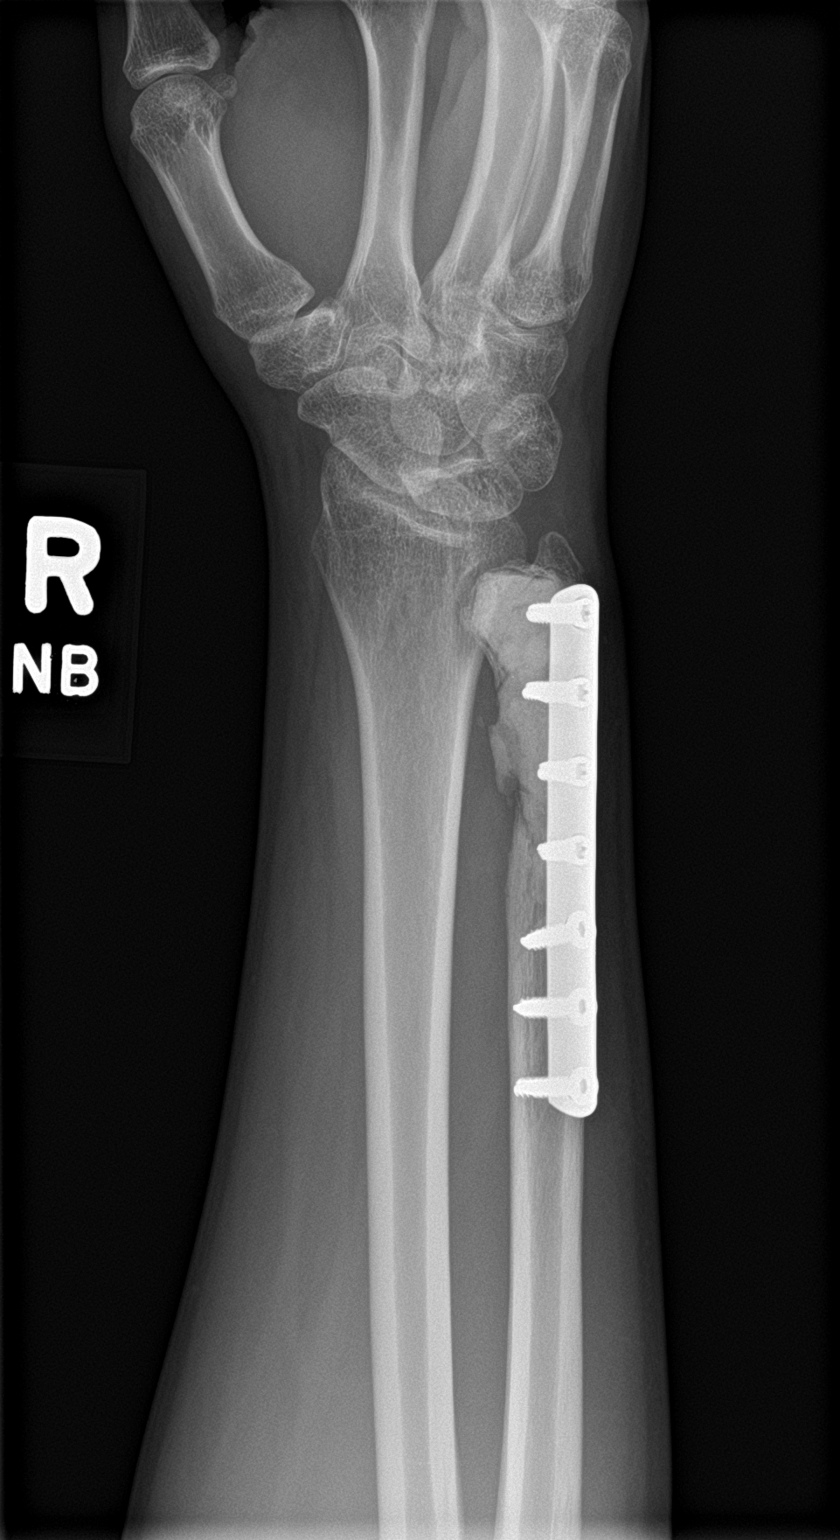

[wrist lat]
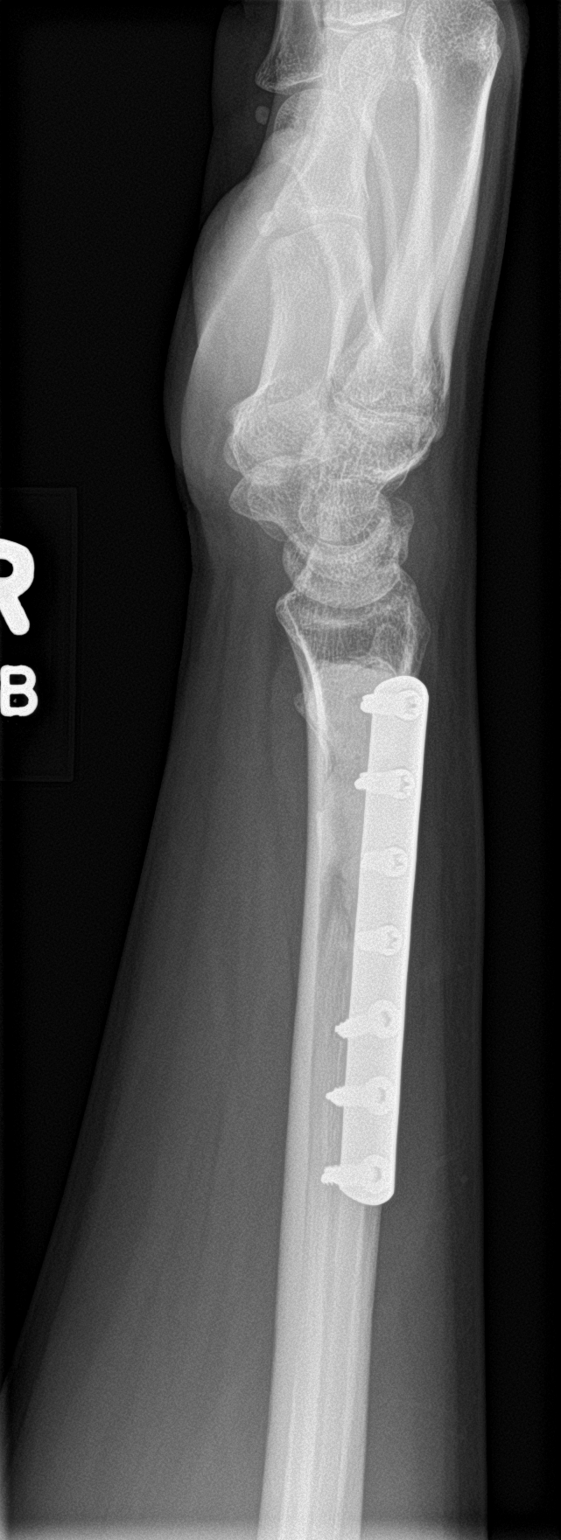

[wrist navicular]
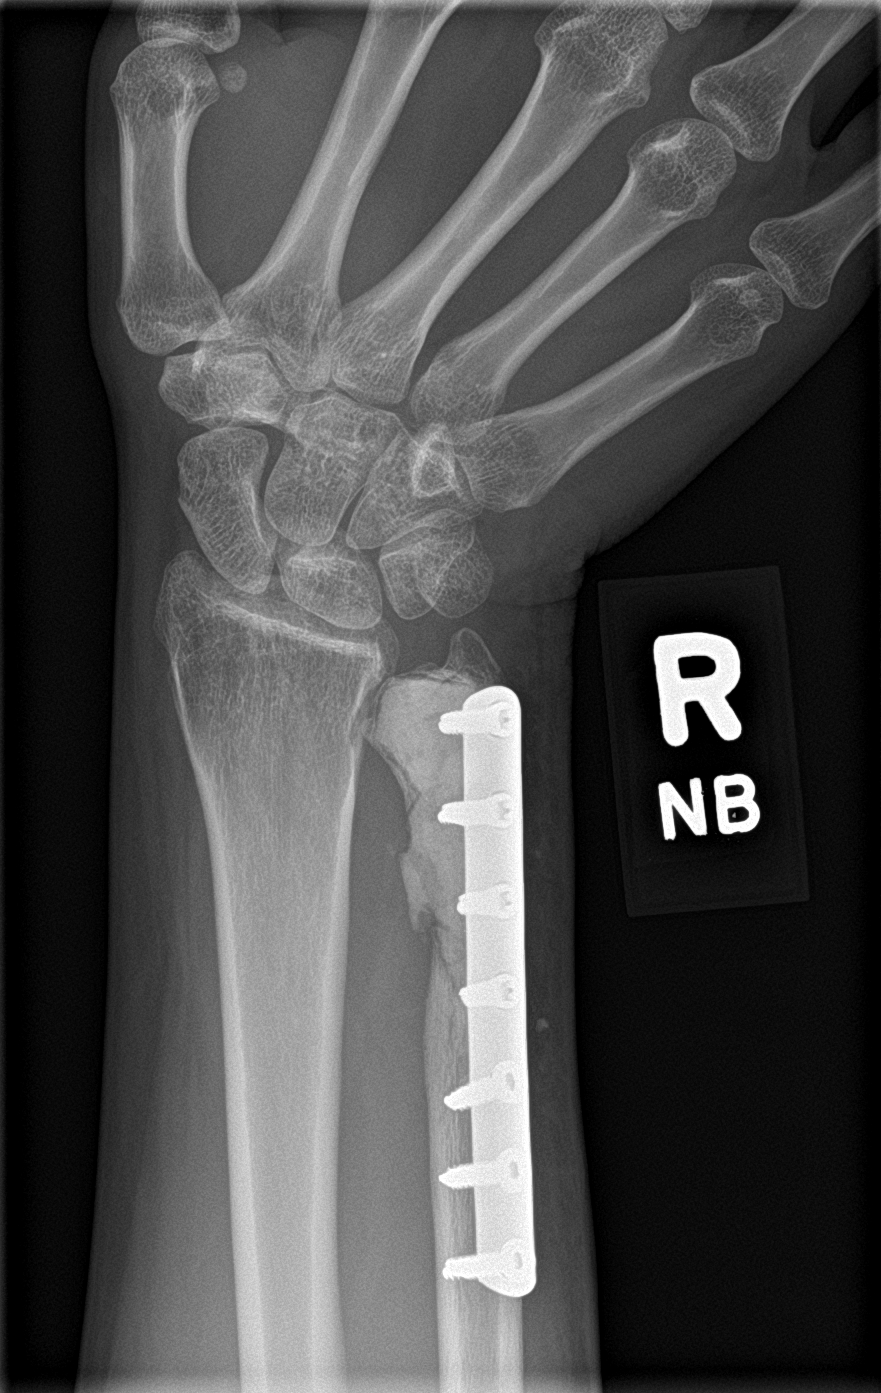

[wrist pa]
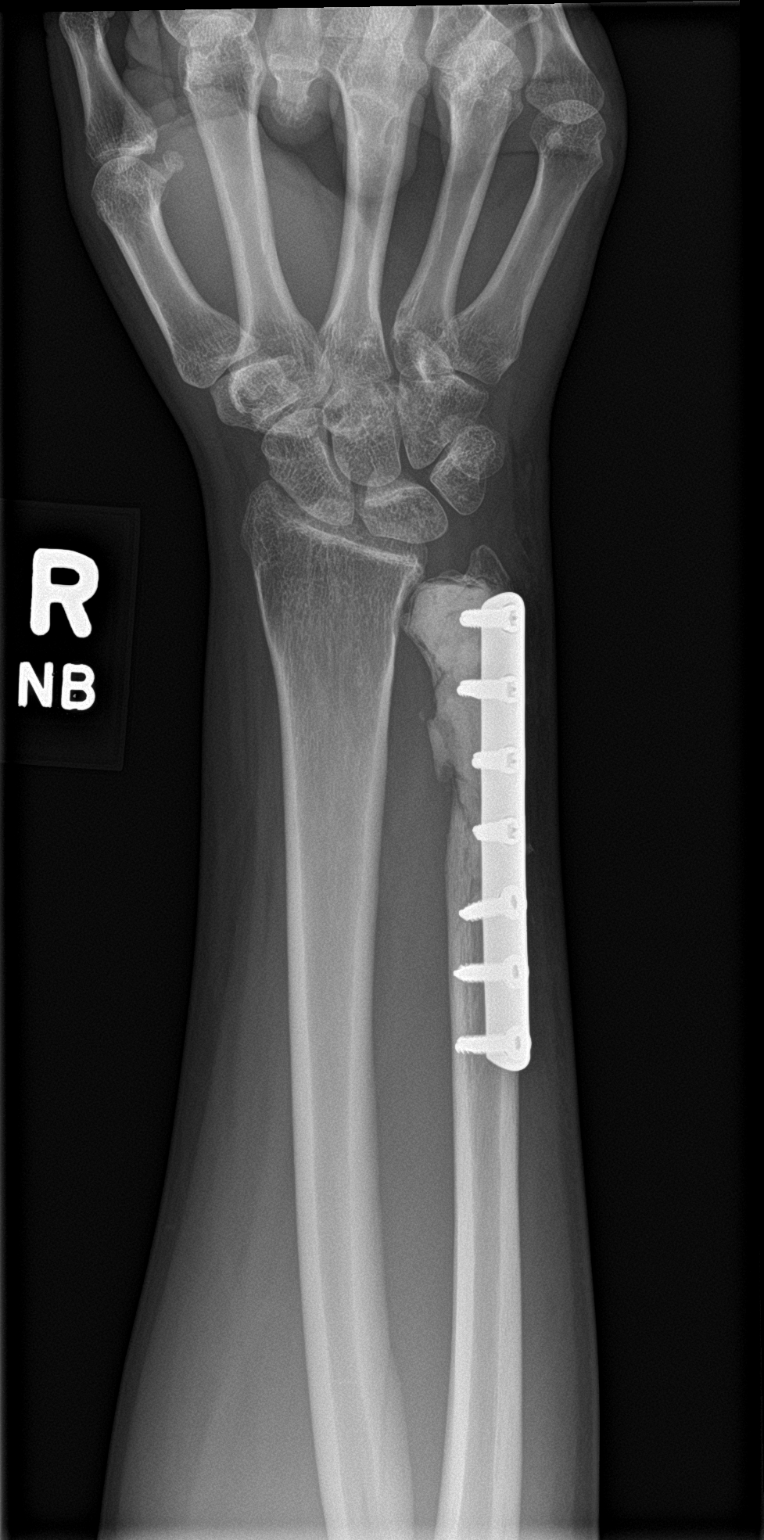

[4 of 4 positions shown; findings below may reference images not displayed]

FINDINGS: Redemonstration of plate and screw fixation of the distal ulna.
Methyl methacrylate packing is again seen within the distal ulna. No
new fracture line is seen. There is again thin curvilinear lucency
within the distal lateral aspect of the ulnar diaphysis.
IMPRESSION: No significant change from 10/09/2021. Postsurgical changes of
distal ulnar giant cell removal and packing with plate and screw
fixation of subacute distal ulnar fracture.

## 2023-05-02 ENCOUNTER — Telehealth (HOSPITAL_COMMUNITY): Payer: Self-pay | Admitting: *Deleted

## 2023-05-02 ENCOUNTER — Encounter (HOSPITAL_COMMUNITY): Payer: Self-pay | Admitting: Obstetrics

## 2023-05-02 ENCOUNTER — Inpatient Hospital Stay (HOSPITAL_COMMUNITY)
Admission: AD | Admit: 2023-05-02 | Discharge: 2023-05-04 | DRG: 807 | Disposition: A | Discharge status: Home / Self Care | Payer: No Typology Code available for payment source | Attending: Obstetrics and Gynecology | Admitting: Obstetrics and Gynecology

## 2023-05-02 DIAGNOSIS — Z3A40 40 weeks gestation of pregnancy: Secondary | ICD-10-CM | POA: Diagnosis not present

## 2023-05-02 DIAGNOSIS — O9902 Anemia complicating childbirth: Secondary | ICD-10-CM | POA: Diagnosis present

## 2023-05-02 DIAGNOSIS — D509 Iron deficiency anemia, unspecified: Secondary | ICD-10-CM | POA: Diagnosis present

## 2023-05-02 DIAGNOSIS — O26893 Other specified pregnancy related conditions, third trimester: Secondary | ICD-10-CM | POA: Diagnosis present

## 2023-05-02 NOTE — MAU Note (Signed)
.  Sheila Daniels is a 27 y.o. at [redacted]w[redacted]d here in MAU reporting ctxs since 1500. Denies LOF or VB. Reports good FM. Pt was 3cm yesterday. STates GBS neg and does not want an epidural  Onset of complaint: 1500 Pain score: 8 Vitals:   05/02/23 2252  BP: 128/80  Pulse: 89  Resp: 17  SpO2: 99%     FHT:124 Lab orders placed from triage: mau labor eval

## 2023-05-02 NOTE — Telephone Encounter (Signed)
Preadmission screen  

## 2023-05-03 ENCOUNTER — Other Ambulatory Visit: Payer: Self-pay

## 2023-05-03 ENCOUNTER — Encounter (HOSPITAL_COMMUNITY): Payer: Self-pay | Admitting: Obstetrics and Gynecology

## 2023-05-03 DIAGNOSIS — D509 Iron deficiency anemia, unspecified: Secondary | ICD-10-CM | POA: Diagnosis present

## 2023-05-03 LAB — CBC
HCT: 28.8 % — ABNORMAL LOW (ref 36.0–46.0)
Hemoglobin: 9.6 g/dL — ABNORMAL LOW (ref 12.0–15.0)
MCH: 29.8 pg (ref 26.0–34.0)
MCHC: 33.3 g/dL (ref 30.0–36.0)
MCV: 89.4 fL (ref 80.0–100.0)
Platelets: 221 10*3/uL (ref 150–400)
RBC: 3.22 MIL/uL — ABNORMAL LOW (ref 3.87–5.11)
RDW: 13.1 % (ref 11.5–15.5)
WBC: 17.3 10*3/uL — ABNORMAL HIGH (ref 4.0–10.5)
nRBC: 0 % (ref 0.0–0.2)

## 2023-05-03 MED ORDER — DIPHENHYDRAMINE HCL 25 MG PO CAPS: 25.0000 mg | ORAL_CAPSULE | Freq: Four times a day (QID) | ORAL | Status: DC | PRN

## 2023-05-03 MED ORDER — LACTATED RINGERS IV SOLN: INTRAVENOUS | Status: DC

## 2023-05-03 MED ORDER — OXYTOCIN BOLUS FROM INFUSION
333.0000 mL | Freq: Once | INTRAVENOUS | Status: AC
  Administered 2023-05-03: 333 mL via INTRAVENOUS

## 2023-05-03 MED ORDER — FLEET ENEMA 7-19 GM/118ML RE ENEM: 1.0000 | ENEMA | RECTAL | Status: DC | PRN

## 2023-05-03 MED ORDER — PRENATAL MULTIVITAMIN CH
1.0000 | ORAL_TABLET | Freq: Every day | ORAL | Status: DC
  Administered 2023-05-03 – 2023-05-04 (×2): 1 via ORAL
  Filled 2023-05-03 (×2): qty 1

## 2023-05-03 MED ORDER — LIDOCAINE HCL (PF) 1 % IJ SOLN
30.0000 mL | INTRAMUSCULAR | Status: AC | PRN
  Administered 2023-05-03: 30 mL via SUBCUTANEOUS
  Filled 2023-05-03: qty 30

## 2023-05-03 MED ORDER — OXYCODONE-ACETAMINOPHEN 5-325 MG PO TABS: 1.0000 | ORAL_TABLET | ORAL | Status: DC | PRN

## 2023-05-03 MED ORDER — ONDANSETRON HCL 4 MG PO TABS: 4.0000 mg | ORAL_TABLET | ORAL | Status: DC | PRN

## 2023-05-03 MED ORDER — SENNOSIDES-DOCUSATE SODIUM 8.6-50 MG PO TABS
2.0000 | ORAL_TABLET | Freq: Every day | ORAL | Status: DC
  Administered 2023-05-04: 2 via ORAL
  Filled 2023-05-03: qty 2

## 2023-05-03 MED ORDER — ONDANSETRON HCL 4 MG/2ML IJ SOLN: 4.0000 mg | INTRAMUSCULAR | Status: DC | PRN

## 2023-05-03 MED ORDER — SIMETHICONE 80 MG PO CHEW: 80.0000 mg | CHEWABLE_TABLET | ORAL | Status: DC | PRN

## 2023-05-03 MED ORDER — LACTATED RINGERS IV SOLN: 500.0000 mL | INTRAVENOUS | Status: DC | PRN

## 2023-05-03 MED ORDER — IBUPROFEN 600 MG PO TABS
600.0000 mg | ORAL_TABLET | Freq: Four times a day (QID) | ORAL | Status: DC
  Administered 2023-05-03 – 2023-05-04 (×6): 600 mg via ORAL
  Filled 2023-05-03 (×6): qty 1

## 2023-05-03 MED ORDER — OXYCODONE-ACETAMINOPHEN 5-325 MG PO TABS: 2.0000 | ORAL_TABLET | ORAL | Status: DC | PRN

## 2023-05-03 MED ORDER — BENZOCAINE-MENTHOL 20-0.5 % EX AERO
1.0000 | INHALATION_SPRAY | CUTANEOUS | Status: DC | PRN
  Administered 2023-05-03: 1 via TOPICAL
  Filled 2023-05-03: qty 56

## 2023-05-03 MED ORDER — ONDANSETRON HCL 4 MG/2ML IJ SOLN: 4.0000 mg | Freq: Four times a day (QID) | INTRAMUSCULAR | Status: DC | PRN

## 2023-05-03 MED ORDER — DIBUCAINE (PERIANAL) 1 % EX OINT: 1.0000 | TOPICAL_OINTMENT | CUTANEOUS | Status: DC | PRN

## 2023-05-03 MED ORDER — ZOLPIDEM TARTRATE 5 MG PO TABS: 5.0000 mg | ORAL_TABLET | Freq: Every evening | ORAL | Status: DC | PRN

## 2023-05-03 MED ORDER — TETANUS-DIPHTH-ACELL PERTUSSIS 5-2.5-18.5 LF-MCG/0.5 IM SUSY: 0.5000 mL | PREFILLED_SYRINGE | Freq: Once | INTRAMUSCULAR | Status: DC

## 2023-05-03 MED ORDER — ACETAMINOPHEN 325 MG PO TABS: 650.0000 mg | ORAL_TABLET | ORAL | Status: DC | PRN

## 2023-05-03 MED ORDER — OXYTOCIN-SODIUM CHLORIDE 30-0.9 UT/500ML-% IV SOLN
2.5000 [IU]/h | INTRAVENOUS | Status: DC
  Filled 2023-05-03: qty 500

## 2023-05-03 MED ORDER — WITCH HAZEL-GLYCERIN EX PADS: 1.0000 | MEDICATED_PAD | CUTANEOUS | Status: DC | PRN

## 2023-05-03 MED ORDER — COCONUT OIL OIL: 1.0000 | TOPICAL_OIL | Status: DC | PRN

## 2023-05-03 MED ORDER — SOD CITRATE-CITRIC ACID 500-334 MG/5ML PO SOLN: 30.0000 mL | ORAL | Status: DC | PRN

## 2023-05-03 NOTE — H&P (Signed)
OB ADMISSION/ HISTORY & PHYSICAL:  Admission Date: 05/02/2023 10:37 PM  Admit Diagnosis: Normal labor [O80, Z37.9]    Sheila Daniels is a 27 y.o. female G2P1001 at [redacted]w[redacted]d presenting for active labor. Reports contractions started at 1500. Denies leaking of fluid or vaginal bleeding. Endorses + fetal movement. Husband, Sheila Daniels, and doula, Sheila Daniels, present and supportive. Eagerly anticipating a surprise baby  Prenatal History: G2P1001   EDC: 05/03/2023 Prenatal care at [facility] since 11 weeks  Primary: Sheila Daniels  Prenatal course complicated by: Anemia, on PO iron  Prenatal Labs: ABO, Rh: A (12/13 0000)  Antibody: NEG (08/02 0006) Rubella: Immune (01/12 0000)  RPR: Nonreactive (01/12 0000)  HBsAg: Negative (01/12 0000)  HIV: Non-reactive (01/12 0000)  GBS: Negative/-- (07/12 0000)  1 hr Glucola : 134 Genetic Screening: low risk NIPT Ultrasound: normal anatomy    Maternal Diabetes: No Genetic Screening: Normal Maternal Ultrasounds/Referrals: Normal Fetal Ultrasounds or other Referrals:  None Maternal Substance Abuse:  No Significant Maternal Medications:  None Significant Maternal Lab Results:  Group B Strep negative Other Comments:  None  Medical / Surgical History : Past medical history:  Past Medical History:  Diagnosis Date   Medical history non-contributory    No pertinent past medical history     Past surgical history:  Past Surgical History:  Procedure Laterality Date   BONE TUMOR EXCISION Right    Giant cell tumor of bone, right distal ulna; Dx by Dr. Lily Daniels with [facility] in [place], [place]   WISDOM TOOTH EXTRACTION     WRIST SURGERY     2022    Family History:  Family History  Problem Relation Age of Onset   Hypertension Father    Heart attack Maternal Grandfather    Diabetes Maternal Grandfather    Prostate cancer Paternal Grandfather     Social History:  reports that she has never smoked. She has never used smokeless tobacco.  She reports that she does not drink alcohol and does not use drugs.  Allergies: Patient has no known allergies.   Current Medications at time of admission:  Medications Prior to Admission  Medication Sig Dispense Refill Last Dose   iron polysaccharides (FERREX 150) 150 MG capsule Take 1 capsule (150 mg total) by mouth daily.   05/01/2023   Prenatal Vit-Fe Fumarate-FA (PRENATAL VITAMIN PO) Take by mouth daily.   05/02/2023   acetaminophen (TYLENOL) 325 MG tablet Take 2 tablets (650 mg total) by mouth every 4 (four) hours as needed (for pain scale < 4).      Cholecalciferol (VITAMIN D-3 PO) Take by mouth daily.      magnesium oxide (MAG-OX) 400 (240 Mg) MG tablet Take 1 tablet (400 mg total) by mouth daily. For prevention of constipation. 30 tablet      Review of Systems: Review of Systems  All other systems reviewed and are negative.  Physical Exam: Vital signs and nursing notes reviewed.  Patient Vitals for the past 24 hrs:  BP Pulse Resp SpO2 Height Weight  05/02/23 2252 128/80 89 17 99 % 5\' 4"  (1.626 m) 85.7 kg    General: AAO x 3, NAD Heart: RRR Lungs:CTAB Abdomen: Gravid, NT Extremities: no edema SVE: Dilation: 6cm Presentation: Vertex Exam by: RN  FHR: 125BPM, moderate variability, + accels, no decels TOCO: Contractions q 3-4 minutes  Labs:   Recent Labs    05/03/23 0006  WBC 9.6  HGB 10.6*  HCT 32.4*  PLT 225   Assessment/Plan: 27  y.o. G2P1001 at [redacted]w[redacted]d, active labor Mild anemia, on PO iron, admission Hgb 10.6  Fetal wellbeing - FHT category 1 EFW AGA 7-8lbs  Labor: Plan expectant management, AROM prn  GBS negative Rubella immune Rh positive  Pain control: desires unmedicated birth Analgesia/anesthesia PRN  Anticipated MOD: NSVB  Plans to breastfeed, undecided circumcision. POC discussed with patient and support team, all questions answered.  Sheila Daniels notified of admission/plan of care.  Sheila Daniels CNM, MSN 05/03/2023, 1:35 AM

## 2023-05-03 NOTE — Progress Notes (Signed)
S: Working through contractions with husband and doula. Discussed the R/B/A of AROM for augmentation and patient consents to procedure.   O: Vitals:   05/02/23 2252  BP: 128/80  Pulse: 89  Resp: 17  SpO2: 99%  Weight: 85.7 kg  Height: 5\' 4"  (1.626 m)   FHT:  FHR: 125 bpm, variability: moderate,  accelerations:  Present,  decelerations:  Absent UC:   regular, every 4 minutes SVE:   Dilation: 8  AROM of a moderate amount of clear fluid at 0128.   A / P: Spontaneous labor, progressing normally  Fetal Wellbeing:  Category I GBS: Negative Pain Control:  Labor support without medications Anticipated MOD:  NSVD  June Leap, CNM, MSN 05/03/2023, 1:33 AM

## 2023-05-03 NOTE — Lactation Note (Signed)
This note was copied from a baby's chart. Lactation Consultation Note  Patient Name: Sheila Daniels [id number] Date: 05/03/2023 Age:28 hours Reason for consult: Initial assessment;Term.  P2, term female infant ,  Birth Parent infant is latching well at the breast and most feedings are 15 minutes in length. LC did not observe latch, infant recently finished BF prior to [facility] entering the room. Birth Parent will continue to BF infant according to hunger cues, on demand, 8 to 12+ times within 24 hours, skin to skin. Birth Parent is experienced with breastfeeding see maternal data below. Infant had 4 stools and 2 voids since birth. LC discussed the importance of maternal rest, diet and hydration. Birth Parent was made aware of O/P services, breastfeeding support groups, community resources, and our phone # for post-discharge questions.    Maternal Data Has patient been taught Hand Expression?: Yes Does the patient have breastfeeding experience prior to this delivery?: Yes How long did the patient breastfeed?: Per Birth Parent, she BF her son for 20 months who is currently 40 years old.  Feeding Mother's Current Feeding Choice: Breast Milk  LATCH Score  Infant recently finished BF prior to [facility] entering the room. LC did not observe latch at this time.                   Lactation Tools Discussed/Used    Interventions Interventions: Breast feeding basics reviewed;Position options;Skin to skin;Education;LC Services brochure  Discharge Pump: DEBP;Personal  Consult Status Consult Status: Follow-up Date: 05/04/23 Follow-up type: In-patient    Frederico Hamman 05/03/2023, 11:23 PM

## 2023-05-04 ENCOUNTER — Encounter (HOSPITAL_COMMUNITY): Payer: Self-pay | Admitting: Obstetrics and Gynecology

## 2023-05-04 MED ORDER — IBUPROFEN 600 MG PO TABS: 600.0000 mg | ORAL_TABLET | Freq: Four times a day (QID) | ORAL | 0 refills | Status: DC

## 2023-05-04 MED ORDER — SENNOSIDES-DOCUSATE SODIUM 8.6-50 MG PO TABS: 2.0000 | ORAL_TABLET | Freq: Every day | ORAL | Status: DC

## 2023-05-04 MED ORDER — ACETAMINOPHEN 325 MG PO TABS: 650.0000 mg | ORAL_TABLET | ORAL | Status: DC | PRN

## 2023-05-04 NOTE — Discharge Summary (Signed)
Postpartum Discharge Summary  Date of Service updated 05/04/23     Patient Name: Sheila Daniels DOB: 1996-02-09 MRN: [id number]  Date of admission: 05/02/2023 Delivery date:05/03/2023 Delivering provider: Dorisann Frames K Date of discharge: 05/04/2023  Admitting diagnosis: Normal labor [O80, Z37.9] Intrauterine pregnancy: [redacted]w[redacted]d     Secondary diagnosis:  Principal Problem:   Postpartum care following vaginal delivery 5/10 Active Problems:   Perineal second degree, bilateral sulcal, and left labial lacerations with repair   Normal labor   SVD (spontaneous vaginal delivery)   Iron deficiency anemia during pregnancy  Additional problems: n/a    Discharge diagnosis: Term Pregnancy Delivered                                              Post partum procedures: n/a Augmentation: AROM Complications: None  Hospital course: Onset of Labor With Vaginal Delivery      27 y.o. yo [id number] at [redacted]w[redacted]d was admitted in Active Labor on 05/02/2023. Labor course was complicated by none  Membrane Rupture Time/Date: 1:28 AM,05/03/2023  Delivery Method:Vaginal, Spontaneous Operative Delivery:N/A Episiotomy: None Lacerations:  2nd degree;Sulcus;Labial Patient had a postpartum course complicated by  none.  She is ambulating, tolerating a regular diet, passing flatus, and urinating well. Patient is discharged home in stable condition on 05/04/23.  Newborn Data: Birth date:05/03/2023 Birth time:2:38 AM Gender:Female Living status:Living Apgars:9 ,9  Weight:3850 g  Magnesium Sulfate received: No BMZ received: No Rhophylac:N/A MMR:N/A T-DaP:?  Physical exam  Vitals:   05/03/23 1426 05/03/23 1831 05/03/23 2029 05/04/23 0437  BP: 115/83 115/82 102/62 101/77  Pulse: 71 75 77 71  Resp: 18 18 17 16   Temp: 98.1 F (36.7 C) 98.1 F (36.7 C) 98.2 F (36.8 C) 98 F (36.7 C)  TempSrc: Oral Oral Oral Oral  SpO2:      Weight:      Height:       General: alert and no distress Lochia: appropriate Uterine  Fundus: firm Incision: N/A DVT Evaluation: No evidence of DVT seen on physical exam. Labs: Lab Results  Component Value Date   WBC 17.3 (H) 05/03/2023   HGB 9.6 (L) 05/03/2023   HCT 28.8 (L) 05/03/2023   MCV 89.4 05/03/2023   PLT 221 05/03/2023       No data to display         Edinburgh Score:    05/03/2023    2:26 PM  Edinburgh Postnatal Depression Scale Screening Tool  I have been able to laugh and see the funny side of things. 0  I have looked forward with enjoyment to things. 1  I have blamed myself unnecessarily when things went wrong. 2  I have been anxious or worried for no good reason. 2  I have felt scared or panicky for no good reason. 0  Things have been getting on top of me. 0  I have been so unhappy that I have had difficulty sleeping. 0  I have felt sad or miserable. 0  I have been so unhappy that I have been crying. 0  The thought of harming myself has occurred to me. 0  Edinburgh Postnatal Depression Scale Total 5      After visit meds:  Allergies as of 05/04/2023   No Known Allergies      Medication List     TAKE these medications  acetaminophen 325 MG tablet Commonly known as: Tylenol Take 2 tablets (650 mg total) by mouth every 4 (four) hours as needed (for pain scale < 4).   ibuprofen 600 MG tablet Commonly known as: ADVIL Take 1 tablet (600 mg total) by mouth every 6 (six) hours.   IRON PO Take 1 tablet by mouth daily.   PRENATAL VITAMIN PO Take 1 tablet by mouth daily.   senna-docusate 8.6-50 MG tablet Commonly known as: Senokot-S Take 2 tablets by mouth daily.               Discharge Care Instructions  (From admission, onward)           Start     Ordered   05/04/23 0000  Discharge wound care:       Comments: Sitz baths 2 times /day with warm water x 1 week. May add herbals: 1 ounce dried comfrey leaf* 1 ounce calendula flowers 1 ounce lavender flowers  Supplies can be found online at [employer]  sources at [employer], [employer]  1/2 ounce dried uva ursi leaves 1/2 ounce witch hazel blossoms (if you can find them) 1/2 ounce dried sage leaf 1/2 cup sea salt Directions: Bring 2 quarts of water to a boil. Turn off heat, and place 1 ounce (approximately 1 large handful) of the above mixed herbs (not the salt) into the pot. Steep, covered, for 30 minutes.  Strain the liquid well with a fine mesh strainer, and discard the herb material. Add 2 quarts of liquid to the tub, along with the 1/2 cup of salt. This medicinal liquid can also be made into compresses and peri-rinses.   05/04/23 0929             Discharge home in stable condition Infant Feeding: Breast Infant Disposition:home with mother Discharge instruction: per After Visit Summary and Postpartum booklet. Activity: Advance as tolerated. Pelvic rest for 6 weeks.  Diet: routine diet Anticipated Birth Control: Unsure Postpartum Appointment:6 weeks Additional Postpartum F/U: Incision check 1 week-2 weeks Future Appointments:No future appointments. Follow up Visit:  Patient to follow up with Dr. Billy Coast at [facility] in 2 and 6 weeks as instructed.    05/04/2023 Becka Lagasse A Donnita Farina, DO

## 2023-05-04 NOTE — Lactation Note (Signed)
This note was copied from a baby's chart. Lactation Consultation Note  Patient Name: Sheila Daniels [id number] Date: 05/04/2023 Age:28 hours Reason for consult: Follow-up assessment;Term;Infant weight loss (- 5.06% WL)  Visited with family of 26 hours old FT female; [name] is a P2 and experienced breastfeeding. She reports breastfeeding is going well, baby "Sheila Daniels" has been going to breast consistently with LATCH score of 9 and 10. Couplet is getting discharged today. Reviewed discharge education and the importance of consistently feeding baby at least +8 times/24 hours. She politely declined an LC OP referral, but she has their contact info in case she needs to reach out. FOB present. All questions and concerns answered, family to contact [facility] services PRN.  Feeding Mother's Current Feeding Choice: Breast Milk  Interventions Interventions: Breast feeding basics reviewed;Education  Discharge Discharge Education: Engorgement and breast care;Warning signs for feeding baby;Outpatient recommendation Pump: Manual;Personal (DEBP at home)  Consult Status Consult Status: Complete Date: 05/04/23 Follow-up type: Call as needed   Meryl Hubers Venetia Constable 05/04/2023, 12:26 PM

## 2023-05-10 ENCOUNTER — Inpatient Hospital Stay (HOSPITAL_COMMUNITY): Payer: No Typology Code available for payment source

## 2023-05-10 ENCOUNTER — Inpatient Hospital Stay (HOSPITAL_COMMUNITY)
Admission: RE | Admit: 2023-05-10 | Payer: No Typology Code available for payment source | Source: Home / Self Care | Admitting: Obstetrics and Gynecology

## 2023-05-28 ENCOUNTER — Telehealth (HOSPITAL_COMMUNITY): Payer: Self-pay

## 2023-05-28 NOTE — Telephone Encounter (Signed)
05/28/2023 1434  Name: Makala Maddix MRN: [id number] DOB: 01-10-1996  Reason for Call:  Transition of Care Hospital Discharge Call  Contact Status: Patient Contact Status: Message  Language assistant needed: Interpreter Mode: Interpreter Not Needed        Follow-Up Questions:    Inocente Salles Postnatal Depression Scale:  In the Past 7 Days:    PHQ2-9 Depression Scale:     Discharge Follow-up:    Post-discharge interventions: NA  Signature  Signe Colt

## 2023-06-19 LAB — HM PAP SMEAR: HM Pap smear: NEGATIVE

## 2023-07-31 ENCOUNTER — Other Ambulatory Visit: Payer: Self-pay | Admitting: Orthopedic Surgery

## 2023-07-31 ENCOUNTER — Ambulatory Visit: Payer: No Typology Code available for payment source

## 2023-07-31 DIAGNOSIS — D480 Neoplasm of uncertain behavior of bone and articular cartilage: Secondary | ICD-10-CM | POA: Diagnosis not present

## 2024-01-01 ENCOUNTER — Ambulatory Visit (INDEPENDENT_AMBULATORY_CARE_PROVIDER_SITE_OTHER): Admitting: Urgent Care

## 2024-01-01 ENCOUNTER — Encounter: Payer: Self-pay | Admitting: Urgent Care

## 2024-01-01 VITALS — BP 119/77 | HR 82 | Ht 65.0 in | Wt 169.0 lb

## 2024-01-01 DIAGNOSIS — Z131 Encounter for screening for diabetes mellitus: Secondary | ICD-10-CM | POA: Diagnosis not present

## 2024-01-01 DIAGNOSIS — L989 Disorder of the skin and subcutaneous tissue, unspecified: Secondary | ICD-10-CM | POA: Diagnosis not present

## 2024-01-01 DIAGNOSIS — Z0001 Encounter for general adult medical examination with abnormal findings: Secondary | ICD-10-CM

## 2024-01-01 DIAGNOSIS — Z13220 Encounter for screening for lipoid disorders: Secondary | ICD-10-CM

## 2024-01-01 DIAGNOSIS — Z0000 Encounter for general adult medical examination without abnormal findings: Secondary | ICD-10-CM

## 2024-01-01 LAB — CBC WITH DIFFERENTIAL/PLATELET
Basophils Absolute: 0.1 10*3/uL (ref 0.0–0.1)
Basophils Relative: 1.2 % (ref 0.0–3.0)
Eosinophils Absolute: 0.3 10*3/uL (ref 0.0–0.7)
Eosinophils Relative: 5.8 % — ABNORMAL HIGH (ref 0.0–5.0)
HCT: 41.1 % (ref 36.0–46.0)
Hemoglobin: 13.6 g/dL (ref 12.0–15.0)
Lymphocytes Relative: 43.6 % (ref 12.0–46.0)
Lymphs Abs: 2.4 10*3/uL (ref 0.7–4.0)
MCHC: 33 g/dL (ref 30.0–36.0)
MCV: 89.7 fl (ref 78.0–100.0)
Monocytes Absolute: 0.4 10*3/uL (ref 0.1–1.0)
Monocytes Relative: 7.5 % (ref 3.0–12.0)
Neutro Abs: 2.3 10*3/uL (ref 1.4–7.7)
Neutrophils Relative %: 41.9 % — ABNORMAL LOW (ref 43.0–77.0)
Platelets: 298 10*3/uL (ref 150.0–400.0)
RBC: 4.59 Mil/uL (ref 3.87–5.11)
RDW: 12.6 % (ref 11.5–15.5)
WBC: 5.5 10*3/uL (ref 4.0–10.5)

## 2024-01-01 LAB — COMPREHENSIVE METABOLIC PANEL WITH GFR
ALT: 13 U/L (ref 0–35)
AST: 15 U/L (ref 0–37)
Albumin: 4.5 g/dL (ref 3.5–5.2)
Alkaline Phosphatase: 70 U/L (ref 39–117)
BUN: 17 mg/dL (ref 6–23)
CO2: 27 meq/L (ref 19–32)
Calcium: 9.4 mg/dL (ref 8.4–10.5)
Chloride: 106 meq/L (ref 96–112)
Creatinine, Ser: 0.97 mg/dL (ref 0.40–1.20)
GFR: 79.12 mL/min (ref >60.00)
Glucose, Bld: 91 mg/dL (ref 70–99)
Potassium: 4.1 meq/L (ref 3.5–5.1)
Sodium: 141 meq/L (ref 135–145)
Total Bilirubin: 0.5 mg/dL (ref 0.2–1.2)
Total Protein: 7 g/dL (ref 6.0–8.3)

## 2024-01-01 LAB — LIPID PANEL
Cholesterol: 249 mg/dL — ABNORMAL HIGH (ref 0–200)
HDL: 81.6 mg/dL (ref >39.00)
LDL Cholesterol: 151 mg/dL — ABNORMAL HIGH (ref 0–99)
NonHDL: 167.53
Total CHOL/HDL Ratio: 3
Triglycerides: 81 mg/dL (ref 0.0–149.0)
VLDL: 16.2 mg/dL (ref 0.0–40.0)

## 2024-01-01 LAB — HEMOGLOBIN A1C: Hgb A1c MFr Bld: 5.8 % (ref 4.6–6.5)

## 2024-01-01 LAB — TSH: TSH: 1.26 u[IU]/mL (ref 0.35–5.50)

## 2024-01-01 NOTE — Patient Instructions (Signed)
 We completed your annual physical today.  Please read the attached handout. Try 10mg  melatonin nightly to see if this helps with your sleep.  I have referred you to dermatology. Below are the two [facility] locations. Please call to schedule routine skin evaluation: [address], [place], [place] [address] Phone: [phone]  [address], [place], [address].  Phone: [phone]   Please return annually, sooner as needed.

## 2024-01-01 NOTE — Progress Notes (Unsigned)
 Annual Wellness Visit     Patient: Sheila Daniels, Female    DOB: 17-May-1996, 28 y.o.   MRN: [id number]  Subjective  Chief Complaint  Patient presents with   Establish Care    New pt est care. Pt sees Runner, broadcasting/film/video for paps.    Sheila Daniels is a 28 y.o. female who presents today for her Annual Wellness Visit. She reports consuming a general diet. Home exercise routine includes 30 min 3x/ week; cardio and weights. She generally feels well. She reports sleeping fairly well. She does not have additional problems to discuss today.   HPI  Vision:Not within last year  and scheduled on Friday and Dental: No current dental problems, Receives regular dental care, and Last dental visit: sees twice a year   Patient Active Problem List   Diagnosis Date Noted   Normal labor 05/03/2023   SVD (spontaneous vaginal delivery) 05/03/2023   Iron deficiency anemia during pregnancy 05/03/2023   Perineal second degree, bilateral sulcal, and left labial lacerations with repair 02/08/2021   Postpartum care following vaginal delivery 5/10 02/07/2021   Past Medical History:  Diagnosis Date   Medical history non-contributory    No pertinent past medical history    Past Surgical History:  Procedure Laterality Date   BONE TUMOR EXCISION Right    Giant cell tumor of bone, right distal ulna; Dx by Dr. Lily Peer with [facility] in [place], [place]   WISDOM TOOTH EXTRACTION     WRIST SURGERY     2022   Social History   Tobacco Use   Smoking status: Never   Smokeless tobacco: Never  Vaping Use   Vaping status: Never Used  Substance Use Topics   Alcohol use: Never   Drug use: Never      Medications: Outpatient Medications Prior to Visit  Medication Sig   Magnesium Glycinate 100 MG CAPS Take by mouth.   Prenatal Vit-Fe Fumarate-FA (PRENATAL VITAMIN PO) Take 1 tablet by mouth daily.   [DISCONTINUED] acetaminophen (TYLENOL) 325 MG tablet Take 2 tablets (650 mg total) by mouth  every 4 (four) hours as needed (for pain scale < 4).   [DISCONTINUED] Ferrous Sulfate (IRON PO) Take 1 tablet by mouth daily.   [DISCONTINUED] ibuprofen (ADVIL) 600 MG tablet Take 1 tablet (600 mg total) by mouth every 6 (six) hours.   [DISCONTINUED] senna-docusate (SENOKOT-S) 8.6-50 MG tablet Take 2 tablets by mouth daily.   No facility-administered medications prior to visit.    No Known Allergies  Patient Care Team: Maretta Bees, [place] as PCP - General (Physician Assistant)  ROS Complete 12 point ROS performed with all pertinent positives listed in HPI      Objective  BP 119/77   Pulse 82   Ht 5\' 5"  (1.651 m)   Wt 169 lb (76.7 kg)   SpO2 97%   Breastfeeding Yes   BMI 28.12 kg/m  BP Readings from Last 3 Encounters:  01/01/24 119/77  05/04/23 101/77  04/02/23 111/69   Wt Readings from Last 3 Encounters:  01/01/24 169 lb (76.7 kg)  05/02/23 189 lb (85.7 kg)  08/11/21 162 lb 0.6 oz (73.5 kg)      Physical Exam    Most recent functional status assessment:    05/03/2023   12:52 AM  In your present state of health, do you have any difficulty performing the following activities:  Doing errands, shopping? 0   Most recent fall risk assessment:    12/02/2020  10:24 AM  Fall Risk   Falls in the past year? 0  Number falls in past yr: 0  Injury with Fall? 0  Follow up Falls evaluation completed    Most recent depression screenings:    12/02/2020   10:24 AM  PHQ 2/9 Scores  PHQ - 2 Score 3  PHQ- 9 Score 10   Most recent cognitive screening:     No data to display         Most recent Audit-C alcohol use screening    12/27/2023    9:43 AM  Alcohol Use Disorder Test (AUDIT)  1. How often do you have a drink containing alcohol? 0  3. How often do you have six or more drinks on one occasion? 0   A score of 3 or more in women, and 4 or more in men indicates increased risk for alcohol abuse, EXCEPT if all of the points are from question 1   Vision/Hearing  Screen: No results found.  Last CBC Lab Results  Component Value Date   WBC 17.3 (H) 05/03/2023   HGB 9.6 (L) 05/03/2023   HCT 28.8 (L) 05/03/2023   MCV 89.4 05/03/2023   MCH 29.8 05/03/2023   RDW 13.1 05/03/2023   PLT 221 05/03/2023   Last metabolic panel No results found for: "GLUCOSE", "NA", "K", "CL", "CO2", "BUN", "CREATININE", "EGFR", "CALCIUM", "PHOS", "PROT", "ALBUMIN", "LABGLOB", "AGRATIO", "BILITOT", "ALKPHOS", "AST", "ALT", "ANIONGAP" Last lipids No results found for: "CHOL", "HDL", "LDLCALC", "LDLDIRECT", "TRIG", "CHOLHDL" Last hemoglobin A1c No results found for: "HGBA1C" Last thyroid functions No results found for: "TSH", "T3TOTAL", "T4TOTAL", "THYROIDAB" Last vitamin D No results found for: "25OHVITD2", "25OHVITD3", "VD25OH" Last vitamin B12 and Folate No results found for: "VITAMINB12", "FOLATE"    No results found for any visits on 01/01/24.    Assessment & Plan   Annual wellness visit done today including the all of the following: Reviewed patient's Family Medical History Reviewed and updated list of patient's medical providers Assessment of cognitive impairment was done Assessed patient's functional ability Established a written schedule for health screening services Health Risk Assessent Completed and Reviewed  Exercise Activities and Dietary recommendations  Goals   None     Immunization History  Administered Date(s) Administered   DTaP 12/14/1994, 06/20/1995, 09/27/1995, 05/18/1996, 10/10/1999   HIB (PRP-OMP) 12/14/1994, 06/20/1995, 09/27/1995, 05/18/1996   Hepatitis B, ADULT 12/09/2018   Hepatitis B, PED/ADOLESCENT 12/14/1994, 06/20/1995, 05/18/1996   IPV 12/14/1994, 06/20/1995, 09/22/1995, 10/10/1999   Influenza,inj,Quad PF,6+ Mos 08/11/2021   Influenza,inj,Quad PF,6-35 Mos 08/11/2014   Influenza-Unspecified 09/09/2008, 08/17/2020   MMR 05/18/1996, 10/10/1999   Meningococcal B, OMV 08/21/2016, 10/02/2017   Meningococcal Conjugate  07/16/2008, 12/25/2012   PFIZER(Purple Top)SARS-COV-2 Vaccination 05/02/2020, 05/23/2020   PPD Test 12/20/2017, 12/30/2017, 12/09/2018   Tdap 07/16/2008, 12/03/2015   Varicella 11/17/1999    Health Maintenance  Topic Date Due   Hepatitis C Screening  Never done   Cervical Cancer Screening (Pap smear)  Never done   COVID-19 Vaccine (3 - 2024-25 season) 01/28/2025 (Originally 06/02/2023)   INFLUENZA VACCINE  05/01/2024   DTaP/Tdap/Td (8 - Td or Tdap) 12/02/2025   HIV Screening  Completed   HPV VACCINES  Aged Out     Discussed health benefits of physical activity, and encouraged her to engage in regular exercise appropriate for her age and condition.    Problem List Items Addressed This Visit   None   No follow-ups on file.     Maretta Bees, PA

## 2024-01-03 ENCOUNTER — Encounter: Payer: Self-pay | Admitting: Urgent Care

## 2024-07-29 ENCOUNTER — Ambulatory Visit (INDEPENDENT_AMBULATORY_CARE_PROVIDER_SITE_OTHER)

## 2024-07-29 ENCOUNTER — Other Ambulatory Visit: Payer: Self-pay | Admitting: Orthopedic Surgery

## 2024-07-29 DIAGNOSIS — D480 Neoplasm of uncertain behavior of bone and articular cartilage: Secondary | ICD-10-CM | POA: Diagnosis not present

## 2024-07-29 DIAGNOSIS — Z09 Encounter for follow-up examination after completed treatment for conditions other than malignant neoplasm: Secondary | ICD-10-CM | POA: Diagnosis not present

## 2024-09-07 ENCOUNTER — Encounter: Payer: Self-pay | Admitting: Physician Assistant

## 2024-09-07 ENCOUNTER — Ambulatory Visit: Admitting: Physician Assistant

## 2024-09-07 VITALS — BP 103/78 | HR 68

## 2024-09-07 DIAGNOSIS — Z1283 Encounter for screening for malignant neoplasm of skin: Secondary | ICD-10-CM

## 2024-09-07 DIAGNOSIS — L578 Other skin changes due to chronic exposure to nonionizing radiation: Secondary | ICD-10-CM

## 2024-09-07 DIAGNOSIS — D239 Other benign neoplasm of skin, unspecified: Secondary | ICD-10-CM

## 2024-09-07 DIAGNOSIS — L821 Other seborrheic keratosis: Secondary | ICD-10-CM

## 2024-09-07 DIAGNOSIS — W908XXA Exposure to other nonionizing radiation, initial encounter: Secondary | ICD-10-CM

## 2024-09-07 DIAGNOSIS — D229 Melanocytic nevi, unspecified: Secondary | ICD-10-CM

## 2024-09-07 DIAGNOSIS — L814 Other melanin hyperpigmentation: Secondary | ICD-10-CM

## 2024-09-07 DIAGNOSIS — D1801 Hemangioma of skin and subcutaneous tissue: Secondary | ICD-10-CM

## 2024-09-07 NOTE — Progress Notes (Signed)
   New Patient Visit   Subjective  Sheila Daniels is a 28 y.o. female NEW PATIENT who presents for the following:  Total Body Skin Exam (TBSE).   Has a spot of concern on her right buttock.   Patient present today for new patient visit for TBSE.The patient reports she has spots, moles and lesions to be evaluated on her right arm, buttocks and face, some may be new or changing and the patient may have concern these could be cancer. Patient has not previously been treated by a dermatologist.Patient reports she has hx of bx. Patient denies family history of skin cancers. Patient reports throughout her lifetime has had moderate sun exposure. Currently, patient reports if she has excessive sun exposure, she does apply sunscreen and/or wears protective coverings.  The following portions of the chart were reviewed this encounter and updated as appropriate: medications, allergies, medical history  Review of Systems:  No other skin or systemic complaints except as noted in HPI or Assessment and Plan.  Objective  Well appearing patient in no apparent distress; mood and affect are within normal limits.  A full examination was performed including scalp, head, eyes, ears, nose, lips, neck, chest, axillae, abdomen, back, buttocks, bilateral upper extremities, bilateral lower extremities, hands, feet, fingers, toes, fingernails, and toenails. All findings within normal limits unless otherwise noted below.     Relevant exam findings are noted in the Assessment and Plan.    Assessment & Plan   LENTIGINES, SEBORRHEIC KERATOSES, HEMANGIOMAS - Benign normal skin lesions - Benign-appearing - Call for any changes  MELANOCYTIC NEVI - Tan-brown and/or pink-flesh-colored symmetric macules and papules - Benign appearing on exam today - Observation - Call clinic for new or changing moles - Recommend daily use of broad spectrum spf 30+ sunscreen to sun-exposed areas.   ACTINIC DAMAGE - Chronic condition,  secondary to cumulative UV/sun exposure - diffuse scaly erythematous macules with underlying dyspigmentation - Recommend daily broad spectrum sunscreen SPF 30+ to sun-exposed areas, reapply every 2 hours as needed.  - Staying in the shade or wearing long sleeves, sun glasses (UVA+UVB protection) and wide brim hats (4-inch brim around the entire circumference of the hat) are also recommended for sun protection.  - Call for new or changing lesions.  SKIN CANCER SCREENING PERFORMED TODAY  DERMATOFIBROMA Exam: Firm pink/brown papulenodule with dimple sign.  Treatment Plan: A dermatofibroma is a benign growth possibly related to trauma, such as an insect bite, cut from shaving, or inflamed acne-type bump.  Treatment options to remove include shave or excision with resulting scar and risk of recurrence.  Since benign-appearing and not bothersome, will observe for now.   SCREENING EXAM FOR SKIN CANCER   ACTINIC SKIN DAMAGE   LENTIGINES   CHERRY ANGIOMA   MULTIPLE BENIGN NEVI   DERMATOFIBROMA    Return in about 1 year (around 09/07/2025) for TBSE follow up.   I, Doyce Pan, CMA, am acting as scribe for Shalisha Clausing K, PA-C.  Documentation: I have reviewed the above documentation for accuracy and completeness, and I agree with the above.  Racheal Mathurin K, PA-C

## 2024-09-07 NOTE — Patient Instructions (Signed)

## 2024-09-17 ENCOUNTER — Encounter: Payer: Self-pay | Admitting: Urgent Care

## 2024-09-17 DIAGNOSIS — R102 Pelvic and perineal pain unspecified side: Secondary | ICD-10-CM

## 2024-09-25 ENCOUNTER — Ambulatory Visit

## 2024-09-25 ENCOUNTER — Ambulatory Visit: Admitting: Urgent Care

## 2024-09-25 VITALS — BP 101/64 | HR 77 | Ht 65.0 in | Wt 160.0 lb

## 2024-09-25 DIAGNOSIS — R198 Other specified symptoms and signs involving the digestive system and abdomen: Secondary | ICD-10-CM | POA: Diagnosis not present

## 2024-09-25 DIAGNOSIS — R102 Pelvic and perineal pain unspecified side: Secondary | ICD-10-CM | POA: Diagnosis not present

## 2024-09-25 DIAGNOSIS — R1021 Pelvic and perineal pain right side: Secondary | ICD-10-CM

## 2024-09-25 DIAGNOSIS — N761 Subacute and chronic vaginitis: Secondary | ICD-10-CM

## 2024-09-25 LAB — POCT URINALYSIS DIP (CLINITEK)
Bilirubin, UA: NEGATIVE
Blood, UA: NEGATIVE
Glucose, UA: NEGATIVE mg/dL
Ketones, POC UA: NEGATIVE mg/dL
Leukocytes, UA: NEGATIVE
Nitrite, UA: NEGATIVE
POC PROTEIN,UA: NEGATIVE
Spec Grav, UA: 1.02
Urobilinogen, UA: 0.2 U/dL
pH, UA: 6

## 2024-09-25 MED ORDER — METRONIDAZOLE 500 MG PO TABS: 500.0000 mg | ORAL_TABLET | Freq: Two times a day (BID) | ORAL | 0 refills | Status: AC

## 2024-09-25 NOTE — Patient Instructions (Signed)
 I suspect you have bacterial vaginosis. This is not an STD. It is an over growth of the normal bacteria in the vagina. Scented soaps, tampons, perfumes can cause this.  Taking an OTC vaginal probiotic can help.  Please take metronidazole  twice daily x 7 days. Do NOT drink alcohol while on the antibiotic.   Please also schedule a pelvic ultrasound to assess your urinary / R side symptoms.

## 2024-09-25 NOTE — Progress Notes (Signed)
 "  Established Patient Office Visit  Subjective:  Patient ID: Sheila Daniels, female    DOB: 09/05/1996  Age: 28 y.o. MRN: [id number]  Chief Complaint  Patient presents with   Urinary Frequency    Patient feels that she has urinary frequency that happens monthly- she feels this may be related to her menstrual cycle.    Constipation    C/o history of constipation for years but worse x 1 month. She states every time she passes stool has right hip pain .    vaginal odor    C/o vaginal odor that comes and goes - normal discharge - denies itching.     HPI  Discussed the use of AI scribe software for clinical note transcription with the patient, who gave verbal consent to proceed.  History of Present Illness   Sheila Daniels is a 28 year old female who presents with concerns of unusual vaginal odor and urinary symptoms.  She has been experiencing an unusual vaginal odor for the past few months, described as 'not pleasant' and sometimes 'fishy'. The odor is more noticeable on the two days she works each week, especially when standing or sitting. She has tried changing her underwear and going without underwear to improve ventilation, which helped slightly but did not resolve the issue. No associated itchiness or abnormal discharge is reported.  Her urinary symptoms seem cyclical, coinciding with her menstrual cycle. This month, she experienced increased urinary frequency and urgency, feeling the need to urinate immediately after voiding, but without actual urine retention. She has been trying different positions to alleviate the sensation and has seen a pelvic floor physical therapist. As a physical therapist herself, she has taken pelvic floor classes. She denies urinary retention, itchiness, or signs of infection.  She mentions experiencing pain in her hip area when attempting to have a bowel movement, which requires her to adjust her position to alleviate the discomfort. This pain is only present  during bowel movements and not at other times.  She has increased her exercise routine, now weightlifting three to four times a week and walking daily, in response to a previously elevated A1c. She notes that she sweats a lot during workouts and sometimes does not shower immediately after, especially on workdays when she may remain in sweaty clothes for up to ten hours.  She has a history of two vaginal deliveries, with her youngest child being 45 months old. She has experienced these urinary symptoms since her last delivery, which she attributes to possible muscular issues from childbirth. She denies any sensation of pelvic organ prolapse but does report occasional heaviness.      Patient Active Problem List   Diagnosis Date Noted   Normal labor 05/03/2023   SVD (spontaneous vaginal delivery) 05/03/2023   Iron  deficiency anemia during pregnancy 05/03/2023   Perineal second degree, bilateral sulcal, and left labial lacerations with repair 02/08/2021   Postpartum care following vaginal delivery 5/10 02/07/2021   Past Medical History:  Diagnosis Date   Medical history non-contributory    No pertinent past medical history    Past Surgical History:  Procedure Laterality Date   BONE TUMOR EXCISION Right    Giant cell tumor of bone, right distal ulna; Dx by Dr. Gerda Re with [facility] in [place], [place]   WISDOM TOOTH EXTRACTION     WRIST SURGERY     2022   Social History[1]    ROS: as noted in HPI  Objective:     BP  101/64   Pulse 77   Ht 5' 5 (1.651 m)   Wt 160 lb (72.6 kg)   SpO2 97%   BMI 26.63 kg/m  BP Readings from Last 3 Encounters:  09/25/24 101/64  09/07/24 103/78  01/01/24 119/77   Wt Readings from Last 3 Encounters:  09/25/24 160 lb (72.6 kg)  01/01/24 169 lb (76.7 kg)  05/02/23 189 lb (85.7 kg)      Physical Exam Vitals and nursing note reviewed. Exam conducted with a chaperone present.  Constitutional:      General: She is not in acute  distress.    Appearance: Normal appearance. She is normal weight. She is not ill-appearing, toxic-appearing or diaphoretic.  HENT:     Head: Normocephalic and atraumatic.  Eyes:     General: No scleral icterus.       Right eye: No discharge.        Left eye: No discharge.  Cardiovascular:     Rate and Rhythm: Normal rate.  Pulmonary:     Effort: Pulmonary effort is normal. No respiratory distress.  Abdominal:     General: Abdomen is flat. Bowel sounds are normal. There is no distension.     Palpations: Abdomen is soft.     Tenderness: There is no abdominal tenderness. There is no right CVA tenderness, left CVA tenderness, guarding or rebound.  Genitourinary:    Pubic Area: No rash or pubic lice.      Labia:        Right: No rash, tenderness, lesion or injury.        Left: No rash, tenderness, lesion or injury.      Urethra: No prolapse, urethral pain, urethral swelling or urethral lesion.     Vagina: Vaginal discharge (scant, white, thin) present. No lesions.     Cervix: Normal. No cervical motion tenderness, discharge, friability or lesion.     Uterus: Normal.      Adnexa: Right adnexa normal and left adnexa normal.       Right: No mass, tenderness or fullness.         Left: No mass, tenderness or fullness.       Rectum: Normal.     Comments: Speculum exam did reproduce the R hip pain pt experiences with Bms  Lymphadenopathy:     Lower Body: No right inguinal adenopathy. No left inguinal adenopathy.  Skin:    General: Skin is warm and dry.     Coloration: Skin is not jaundiced.     Findings: No bruising, erythema or rash.  Neurological:     General: No focal deficit present.     Mental Status: She is alert and oriented to person, place, and time.  Psychiatric:        Mood and Affect: Mood normal.        Behavior: Behavior normal.      Results for orders placed or performed in visit on 09/25/24  POCT URINALYSIS DIP (CLINITEK)  Result Value Ref Range   Color, UA yellow  yellow   Clarity, UA clear clear   Glucose, UA negative negative mg/dL   Bilirubin, UA negative negative   Ketones, POC UA negative negative mg/dL   Spec Grav, UA 8.979 8.989 - 1.025   Blood, UA negative negative   pH, UA 6.0 5.0 - 8.0   POC PROTEIN,UA negative negative, trace   Urobilinogen, UA 0.2 0.2 or 1.0 E.U./dL   Nitrite, UA Negative Negative   Leukocytes, UA Negative Negative  Last CBC Lab Results  Component Value Date   WBC 5.5 01/01/2024   HGB 13.6 01/01/2024   HCT 41.1 01/01/2024   MCV 89.7 01/01/2024   MCH 29.8 05/03/2023   RDW 12.6 01/01/2024   PLT 298.0 01/01/2024   Last metabolic panel Lab Results  Component Value Date   GLUCOSE 91 01/01/2024   NA 141 01/01/2024   K 4.1 01/01/2024   CL 106 01/01/2024   CO2 27 01/01/2024   BUN 17 01/01/2024   CREATININE 0.97 01/01/2024   GFR 79.12 01/01/2024   CALCIUM 9.4 01/01/2024   PROT 7.0 01/01/2024   ALBUMIN 4.5 01/01/2024   BILITOT 0.5 01/01/2024   ALKPHOS 70 01/01/2024   AST 15 01/01/2024   ALT 13 01/01/2024   Last lipids Lab Results  Component Value Date   CHOL 249 (H) 01/01/2024   HDL 81.60 01/01/2024   LDLCALC 151 (H) 01/01/2024   TRIG 81.0 01/01/2024   CHOLHDL 3 01/01/2024   Last hemoglobin A1c Lab Results  Component Value Date   HGBA1C 5.8 01/01/2024   Last thyroid functions Lab Results  Component Value Date   TSH 1.26 01/01/2024   Last vitamin D No results found for: 25OHVITD2, 25OHVITD3, VD25OH Last vitamin B12 and Folate No results found for: VITAMINB12, FOLATE    The ASCVD Risk score (Arnett DK, et al., 2019) failed to calculate for the following reasons:   The 2019 ASCVD risk score is only valid for ages 58 to 50   * - Cholesterol units were assumed  Assessment & Plan:  Subacute vaginitis -     NuSwab Vaginitis Plus (VG+) -     metroNIDAZOLE ; Take 1 tablet (500 mg total) by mouth 2 (two) times daily for 7 days.  Dispense: 14 tablet; Refill: 0  Cyclical pelvic  pain -     US  PELVIC COMPLETE WITH TRANSVAGINAL; Future -     POCT URINALYSIS DIP (CLINITEK)  Pain associated with defecation  Assessment and Plan    Evaluation of abnormal vaginal odor - Performed vaginal swab to evaluate for infection. - based on symptoms and appearance, start empiric tx with flagyl   Cyclical pelvic pain Question cyst vs broad ligament irritation/ inflammation. Pelvic exam reproduced symptoms - obtain pelvic US  to further assess pelvic structures  Evaluation of urinary frequency Cyclical urinary frequency noted around ovulation. Symptoms improved since onset. Increased exercise may contribute. - Obtained urine sample for analysis which was normal in office.  General health maintenance Increased exercise regimen to improve muscle strength and manage A1c levels. - Schedule next Pap smear in 2027.         No follow-ups on file.   Benton LITTIE Gave, PA    [1]  Social History Tobacco Use   Smoking status: Never   Smokeless tobacco: Never  Vaping Use   Vaping status: Never Used  Substance Use Topics   Alcohol use: Never   Drug use: Never   "

## 2024-09-26 ENCOUNTER — Encounter: Payer: Self-pay | Admitting: Urgent Care

## 2024-09-27 ENCOUNTER — Ambulatory Visit: Payer: Self-pay | Admitting: Urgent Care

## 2024-09-27 LAB — NUSWAB VAGINITIS PLUS (VG+)
Candida albicans, NAA: NEGATIVE
Candida glabrata, NAA: NEGATIVE
Chlamydia trachomatis, NAA: NEGATIVE
Neisseria gonorrhoeae, NAA: NEGATIVE
Trich vag by NAA: NEGATIVE

## 2024-09-28 NOTE — Telephone Encounter (Signed)
 Not sure about the rectal prolapse it certainly possible but she would probably have to have an exam to determine that.  Okay to schedule with Whitney when she returns.  Not urgent.  Could also be an internal hemorrhoid protruding it just depends.

## 2024-10-16 ENCOUNTER — Ambulatory Visit (INDEPENDENT_AMBULATORY_CARE_PROVIDER_SITE_OTHER): Admitting: Urgent Care

## 2024-10-16 VITALS — BP 120/80 | HR 91 | Resp 20

## 2024-10-16 DIAGNOSIS — R1031 Right lower quadrant pain: Secondary | ICD-10-CM | POA: Diagnosis not present

## 2024-10-16 DIAGNOSIS — K623 Rectal prolapse: Secondary | ICD-10-CM

## 2024-10-16 DIAGNOSIS — R102 Pelvic and perineal pain unspecified side: Secondary | ICD-10-CM | POA: Diagnosis not present

## 2024-10-16 DIAGNOSIS — R195 Other fecal abnormalities: Secondary | ICD-10-CM | POA: Diagnosis not present

## 2024-10-16 NOTE — Progress Notes (Unsigned)
" ° °  Established Patient Office Visit  Subjective:  Patient ID: Sheila Daniels, female    DOB: 04/13/1996  Age: 29 y.o. MRN: [id number]  Chief Complaint  Patient presents with   Results    HPI  {History (Optional):23778}  ROS: as noted in HPI  Objective:     There were no vitals taken for this visit. {Vitals History (Optional):23777}  Physical Exam   No results found for any visits on 10/16/24.  {Labs (Optional):23779}  The ASCVD Risk score (Arnett DK, et al., 2019) failed to calculate for the following reasons:   The 2019 ASCVD risk score is only valid for ages 76 to 89  Assessment & Plan:  There are no diagnoses linked to this encounter.   No follow-ups on file.   Benton LITTIE Gave, PA "

## 2024-10-16 NOTE — Patient Instructions (Signed)
 Continue pelvic PT. If symptoms persist, please perform the labs and workup recommended today.

## 2024-10-17 ENCOUNTER — Encounter: Payer: Self-pay | Admitting: Urgent Care

## 2025-01-01 ENCOUNTER — Encounter: Admitting: Urgent Care

## 2025-09-15 ENCOUNTER — Ambulatory Visit: Admitting: Physician Assistant
